# Patient Record
Sex: Male | Born: 1950 | Race: Black or African American | Hispanic: No | Marital: Married | Smoking: Never smoker
Health system: Southern US, Community
[De-identification: ages and names within clinical notes are randomized; demographics above are authoritative.]

## PROBLEM LIST (undated history)

## (undated) DIAGNOSIS — N183 Chronic kidney disease, stage 3 unspecified: Secondary | ICD-10-CM

## (undated) DIAGNOSIS — Z9289 Personal history of other medical treatment: Secondary | ICD-10-CM

## (undated) DIAGNOSIS — I779 Disorder of arteries and arterioles, unspecified: Secondary | ICD-10-CM

## (undated) DIAGNOSIS — D490 Neoplasm of unspecified behavior of digestive system: Secondary | ICD-10-CM

## (undated) DIAGNOSIS — G4733 Obstructive sleep apnea (adult) (pediatric): Secondary | ICD-10-CM

## (undated) DIAGNOSIS — G459 Transient cerebral ischemic attack, unspecified: Secondary | ICD-10-CM

## (undated) DIAGNOSIS — I493 Ventricular premature depolarization: Secondary | ICD-10-CM

## (undated) DIAGNOSIS — I10 Essential (primary) hypertension: Secondary | ICD-10-CM

## (undated) DIAGNOSIS — K219 Gastro-esophageal reflux disease without esophagitis: Secondary | ICD-10-CM

## (undated) DIAGNOSIS — I739 Peripheral vascular disease, unspecified: Secondary | ICD-10-CM

## (undated) DIAGNOSIS — E785 Hyperlipidemia, unspecified: Secondary | ICD-10-CM

## (undated) DIAGNOSIS — I2510 Atherosclerotic heart disease of native coronary artery without angina pectoris: Secondary | ICD-10-CM

## (undated) HISTORY — DX: Ventricular premature depolarization: I49.3

## (undated) HISTORY — DX: Chronic kidney disease, stage 3 unspecified: N18.30

## (undated) HISTORY — DX: Atherosclerotic heart disease of native coronary artery without angina pectoris: I25.10

## (undated) HISTORY — DX: Morbid (severe) obesity due to excess calories: E66.01

## (undated) HISTORY — DX: Hyperlipidemia, unspecified: E78.5

## (undated) HISTORY — DX: Essential (primary) hypertension: I10

## (undated) HISTORY — DX: Neoplasm of unspecified behavior of digestive system: D49.0

## (undated) HISTORY — DX: Peripheral vascular disease, unspecified: I73.9

## (undated) HISTORY — DX: Transient cerebral ischemic attack, unspecified: G45.9

## (undated) HISTORY — DX: Personal history of other medical treatment: Z92.89

## (undated) HISTORY — DX: Obstructive sleep apnea (adult) (pediatric): G47.33

## (undated) HISTORY — DX: Disorder of arteries and arterioles, unspecified: I77.9

## (undated) HISTORY — DX: Chronic kidney disease, stage 3 (moderate): N18.3

## (undated) HISTORY — DX: Gastro-esophageal reflux disease without esophagitis: K21.9

---

## 2003-03-07 ENCOUNTER — Encounter: Payer: Self-pay | Admitting: Internal Medicine

## 2004-04-29 ENCOUNTER — Ambulatory Visit: Payer: Self-pay | Admitting: Internal Medicine

## 2004-07-30 ENCOUNTER — Emergency Department (HOSPITAL_COMMUNITY): Admission: EM | Admit: 2004-07-30 | Discharge: 2004-07-30 | Payer: Self-pay | Admitting: Emergency Medicine

## 2004-07-30 ENCOUNTER — Ambulatory Visit: Payer: Self-pay | Admitting: Internal Medicine

## 2004-07-31 ENCOUNTER — Ambulatory Visit: Payer: Self-pay | Admitting: Internal Medicine

## 2004-08-08 ENCOUNTER — Ambulatory Visit: Payer: Self-pay | Admitting: Internal Medicine

## 2004-08-16 ENCOUNTER — Encounter: Payer: Self-pay | Admitting: Internal Medicine

## 2004-08-26 ENCOUNTER — Ambulatory Visit: Payer: Self-pay | Admitting: Internal Medicine

## 2005-03-20 ENCOUNTER — Ambulatory Visit: Payer: Self-pay | Admitting: Internal Medicine

## 2005-05-21 ENCOUNTER — Ambulatory Visit: Payer: Self-pay | Admitting: Internal Medicine

## 2005-05-23 ENCOUNTER — Ambulatory Visit: Payer: Self-pay | Admitting: Internal Medicine

## 2005-07-11 ENCOUNTER — Ambulatory Visit: Payer: Self-pay | Admitting: Internal Medicine

## 2005-11-14 ENCOUNTER — Ambulatory Visit: Payer: Self-pay | Admitting: Internal Medicine

## 2005-12-16 ENCOUNTER — Ambulatory Visit: Payer: Self-pay | Admitting: Internal Medicine

## 2005-12-30 ENCOUNTER — Ambulatory Visit: Payer: Self-pay | Admitting: Internal Medicine

## 2006-02-23 ENCOUNTER — Ambulatory Visit: Payer: Self-pay | Admitting: Family Medicine

## 2006-05-22 ENCOUNTER — Ambulatory Visit: Payer: Self-pay | Admitting: Internal Medicine

## 2006-06-12 ENCOUNTER — Ambulatory Visit: Payer: Self-pay | Admitting: Internal Medicine

## 2006-06-22 ENCOUNTER — Ambulatory Visit: Payer: Self-pay | Admitting: Internal Medicine

## 2006-06-22 LAB — CONVERTED CEMR LAB
ALT: 18 units/L (ref 0–40)
Albumin: 3.2 g/dL — ABNORMAL LOW (ref 3.5–5.2)
Calcium: 8.7 mg/dL (ref 8.4–10.5)
Creatinine, Ser: 1.4 mg/dL (ref 0.4–1.5)
Glucose, Bld: 106 mg/dL — ABNORMAL HIGH (ref 70–99)
PSA: 0.21 ng/mL (ref 0.10–4.00)
Sodium: 140 meq/L (ref 135–145)
Triglycerides: 91 mg/dL (ref 0–149)

## 2006-11-13 DIAGNOSIS — I6350 Cerebral infarction due to unspecified occlusion or stenosis of unspecified cerebral artery: Secondary | ICD-10-CM | POA: Insufficient documentation

## 2006-11-13 DIAGNOSIS — J309 Allergic rhinitis, unspecified: Secondary | ICD-10-CM | POA: Insufficient documentation

## 2006-11-13 DIAGNOSIS — I10 Essential (primary) hypertension: Secondary | ICD-10-CM | POA: Insufficient documentation

## 2007-01-18 ENCOUNTER — Ambulatory Visit: Payer: Self-pay | Admitting: Internal Medicine

## 2007-01-18 DIAGNOSIS — E785 Hyperlipidemia, unspecified: Secondary | ICD-10-CM | POA: Insufficient documentation

## 2007-01-18 DIAGNOSIS — R079 Chest pain, unspecified: Secondary | ICD-10-CM

## 2007-01-18 DIAGNOSIS — F438 Other reactions to severe stress: Secondary | ICD-10-CM | POA: Insufficient documentation

## 2007-01-21 ENCOUNTER — Encounter: Payer: Self-pay | Admitting: Internal Medicine

## 2007-01-22 ENCOUNTER — Ambulatory Visit: Payer: Self-pay | Admitting: Internal Medicine

## 2007-01-25 ENCOUNTER — Ambulatory Visit: Payer: Self-pay | Admitting: Internal Medicine

## 2007-01-25 ENCOUNTER — Telehealth: Payer: Self-pay | Admitting: Internal Medicine

## 2007-02-09 ENCOUNTER — Ambulatory Visit: Payer: Self-pay | Admitting: Internal Medicine

## 2007-02-10 ENCOUNTER — Encounter (INDEPENDENT_AMBULATORY_CARE_PROVIDER_SITE_OTHER): Payer: Self-pay | Admitting: *Deleted

## 2007-02-19 ENCOUNTER — Ambulatory Visit: Payer: Self-pay | Admitting: Internal Medicine

## 2007-03-07 ENCOUNTER — Encounter: Payer: Self-pay | Admitting: Internal Medicine

## 2007-03-08 ENCOUNTER — Ambulatory Visit: Payer: Self-pay | Admitting: Internal Medicine

## 2007-04-09 ENCOUNTER — Ambulatory Visit: Payer: Self-pay | Admitting: Internal Medicine

## 2007-04-19 ENCOUNTER — Telehealth: Payer: Self-pay | Admitting: Family Medicine

## 2007-04-20 ENCOUNTER — Encounter: Payer: Self-pay | Admitting: Internal Medicine

## 2007-04-22 HISTORY — PX: CARDIAC CATHETERIZATION: SHX172

## 2007-04-29 ENCOUNTER — Ambulatory Visit: Payer: Self-pay | Admitting: Internal Medicine

## 2007-07-05 ENCOUNTER — Ambulatory Visit: Payer: Self-pay | Admitting: Cardiovascular Disease

## 2007-07-23 ENCOUNTER — Encounter (INDEPENDENT_AMBULATORY_CARE_PROVIDER_SITE_OTHER): Payer: Self-pay | Admitting: *Deleted

## 2007-08-30 ENCOUNTER — Encounter: Payer: Self-pay | Admitting: Internal Medicine

## 2008-02-09 ENCOUNTER — Ambulatory Visit: Payer: Self-pay | Admitting: Gastroenterology

## 2008-03-27 ENCOUNTER — Ambulatory Visit: Payer: Self-pay | Admitting: Internal Medicine

## 2008-04-19 ENCOUNTER — Ambulatory Visit: Payer: Self-pay | Admitting: Internal Medicine

## 2008-05-08 ENCOUNTER — Ambulatory Visit: Payer: Self-pay | Admitting: Orthopedic Surgery

## 2009-01-15 ENCOUNTER — Ambulatory Visit: Payer: Self-pay | Admitting: Psychiatry

## 2009-01-22 ENCOUNTER — Ambulatory Visit: Payer: Self-pay | Admitting: Psychiatry

## 2009-04-21 HISTORY — PX: CARDIAC CATHETERIZATION: SHX172

## 2010-03-13 ENCOUNTER — Ambulatory Visit: Payer: Self-pay | Admitting: Cardiovascular Disease

## 2010-05-17 IMAGING — CT CT HEAD WITHOUT CONTRAST
2 series · 15 of 30 positions shown, 19 images · non-contrast
Comparison: none

REASON FOR EXAM: Head Injury
COMMENTS:

[Series 2: without · axial · non-contrast · 0.41mm/px · z∈[-126,+4]mm · 13 of 32 slices shown, 17 images]
[im 3/32  brain]
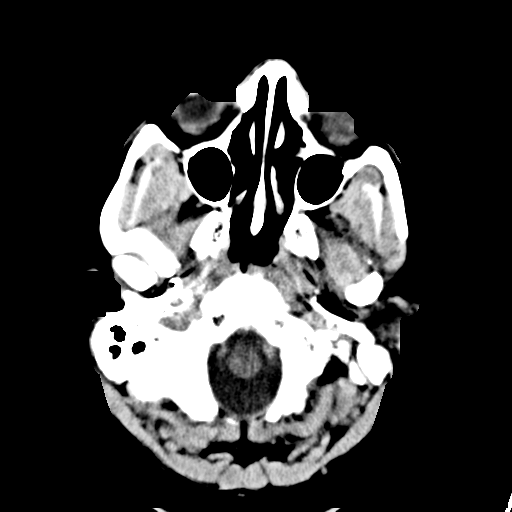
[im 3/32  bone]
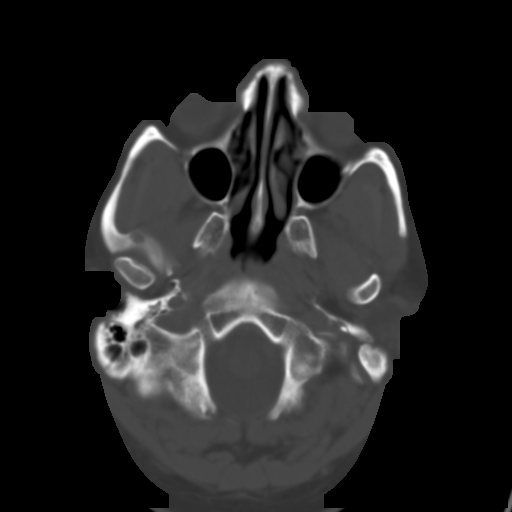
[im 5/32  brain]
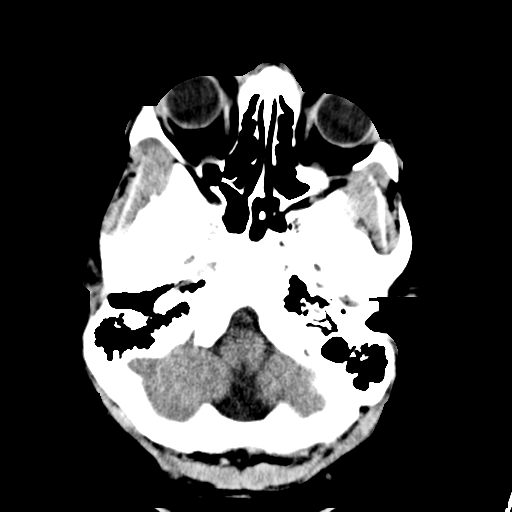
[im 7/32  brain]
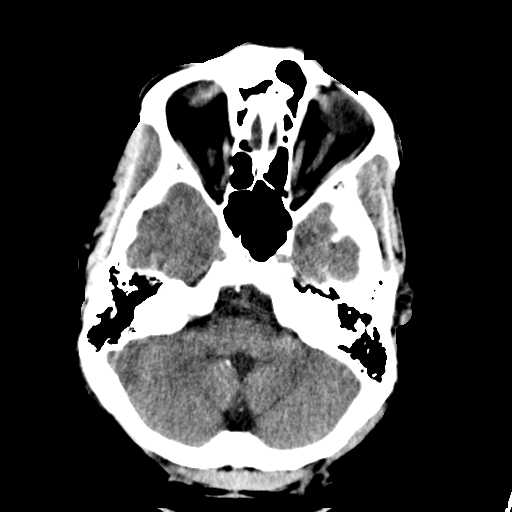
[im 9/32  brain]
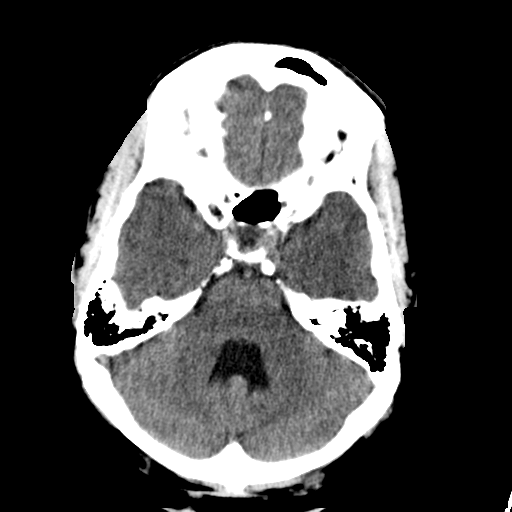
[im 12/32  brain]
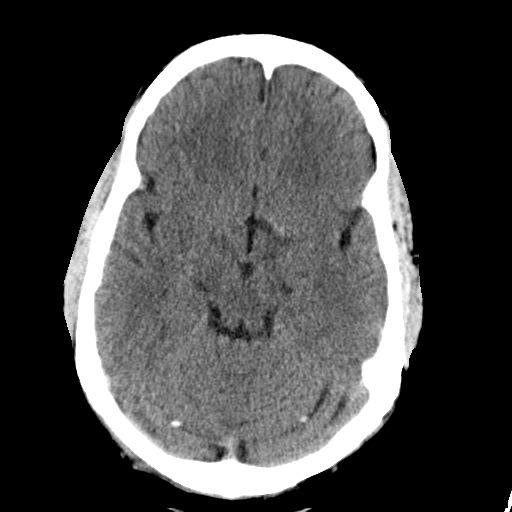
[im 12/32  bone]
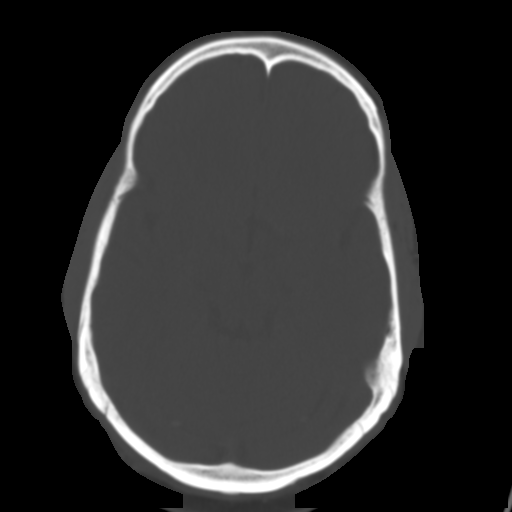
[im 14/32  brain]
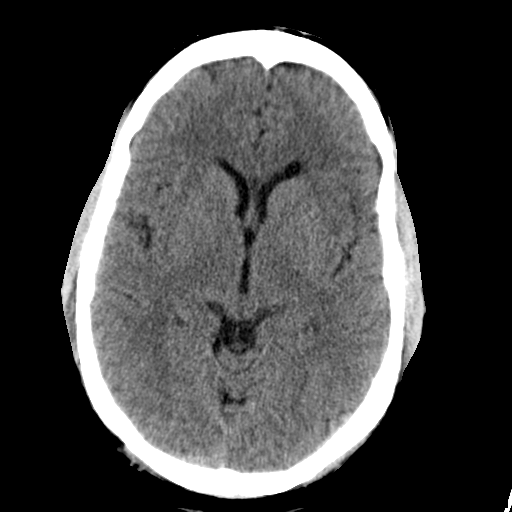
[im 16/32  brain]
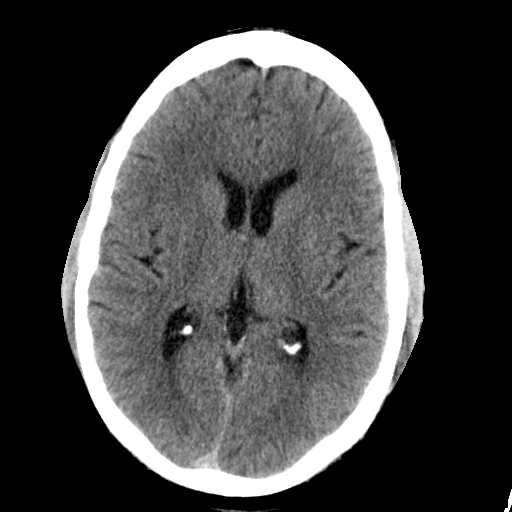
[im 18/32  brain]
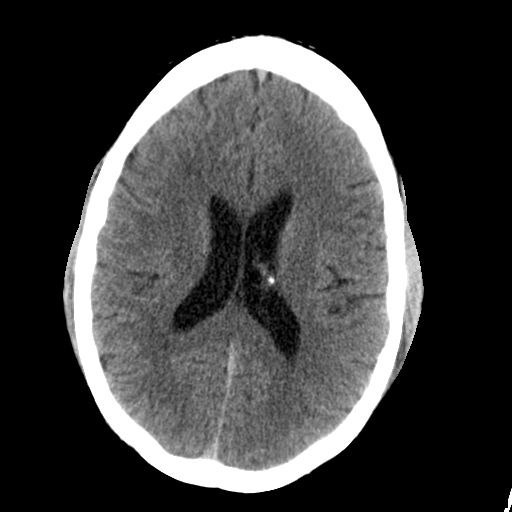
[im 20/32  brain]
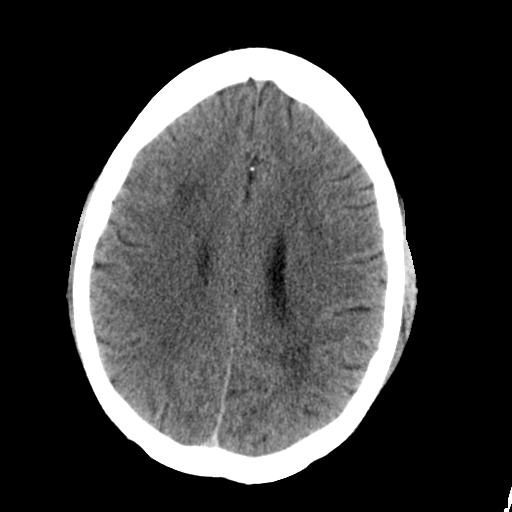
[im 20/32  bone]
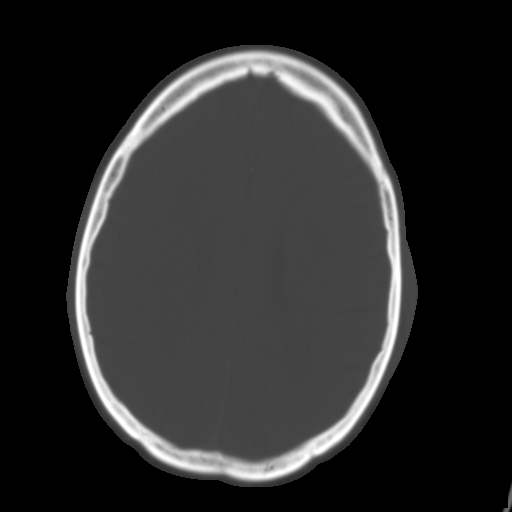
[im 23/32  brain]
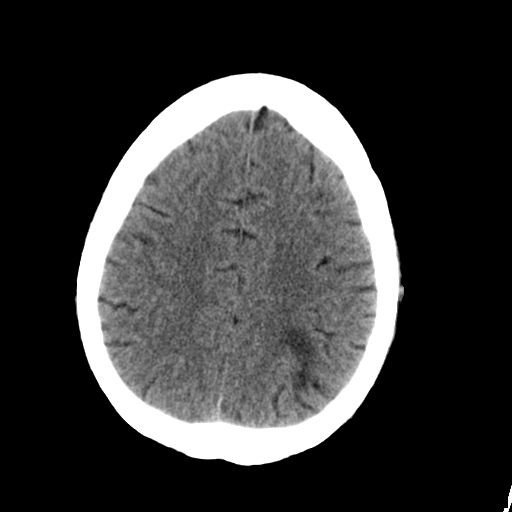
[im 25/32  brain]
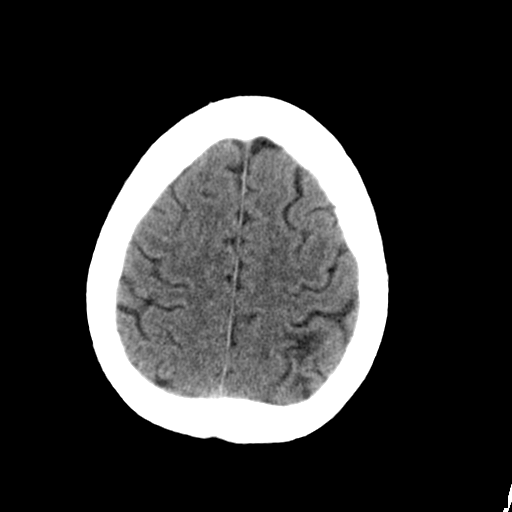
[im 27/32  brain]
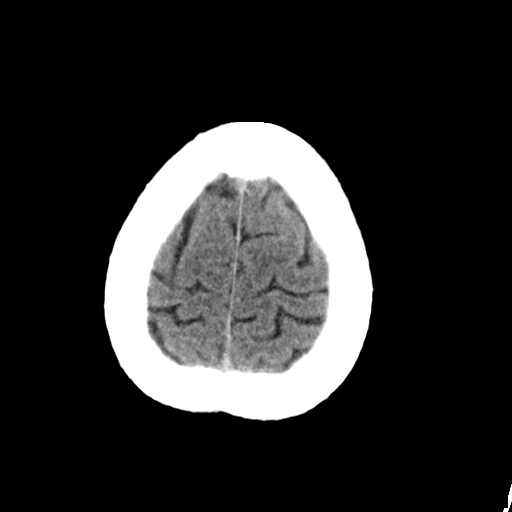
[im 29/32  brain]
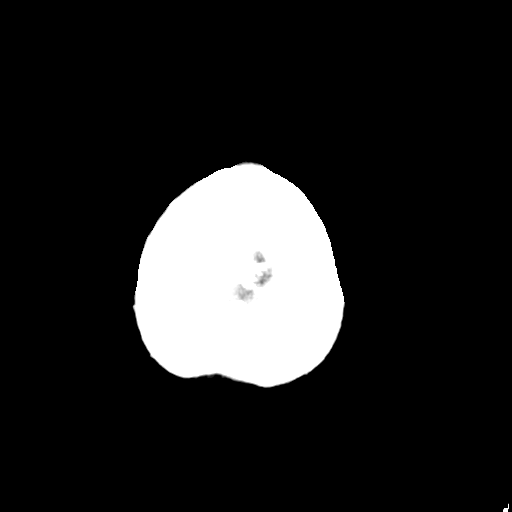
[im 29/32  bone]
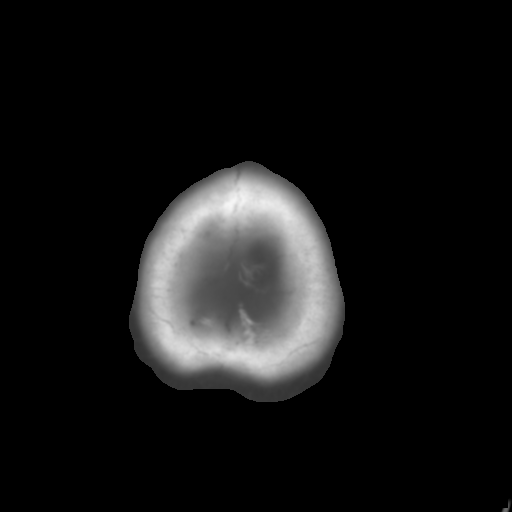

[Series 3: bone · axial · 0.41mm/px · z∈[-126,-106]mm · 2 of 32 slices shown]
[im 3/32  bone]
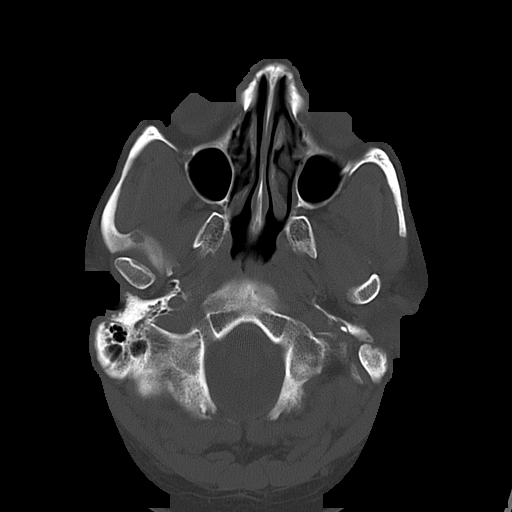
[im 7/32  bone]
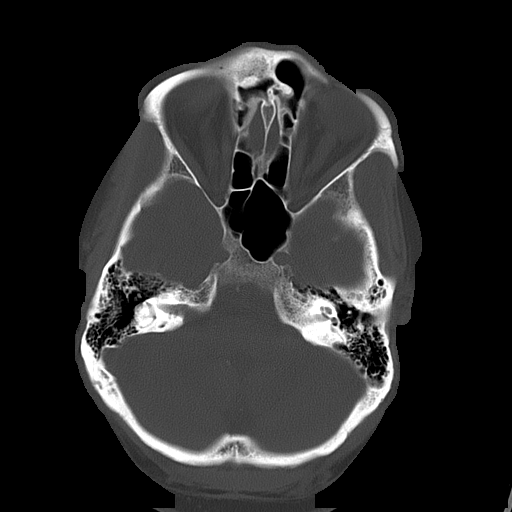

[15 of 30 positions shown; findings below may reference images not displayed]

PROCEDURE:     CT  - CT HEAD WITHOUT CONTRAST  - April 19, 2008  [DATE]

RESULT:     The patient has a history of head injury approximately 2 weeks
ago. Axial imaging was performed through the brain without IV contrast and
images were acquired at soft tissue and bone windows.

There is abnormal hypodensity in the posterior parietal deep white matter on
the LEFT. There is somewhat similar but slightly less conspicuous
hypodensity in the deep white matter of the RIGHT frontal lobe and RIGHT
posterior parietal lobes. These findings may reflect the sequelae of chronic
small vessel ischemia. They are not classic for posttraumatic injury though
this cannot be entirely excluded. The ventricles are normal in size and
position. There is no evidence of an intracranial hemorrhage. The cerebellum
and brainstem exhibit no acute abnormality. At bone window settings there is
no evidence of an acute skull fracture. The mastoid air cells are well
pneumatized and the observed portions of the paranasal sinuses are clear.
IMPRESSION: 1. I do not see evidence of acute or subacute or old intracranial
hemorrhage.
2. Hypodensity in the deep white matter of both cerebral hemispheres as
described is most compatible with prior small vessel ischemic type insult.
This may indicate underlying chronic hypertension as well. Follow-up MRI is
recommended to better characterize these areas of hypodensity.
3. There is no evidence of hydrocephalus and no acute bony abnormality of
the calvarium is seen.

## 2010-06-05 IMAGING — NM NM BONE LIMITED
1 series · 10 of 10 positions shown · non-contrast
Comparison: none

REASON FOR EXAM: left chest shoulder rib  pain
COMMENTS:

[Series 1000: bone statics · 2.40mm/px · 5 acquisitions, 10 frames shown]
[im 1/5]
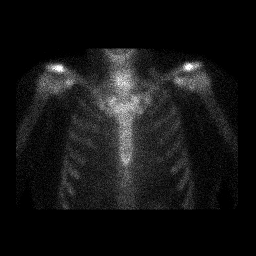
[im 1/5]
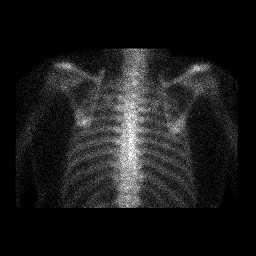
[im 2/5]
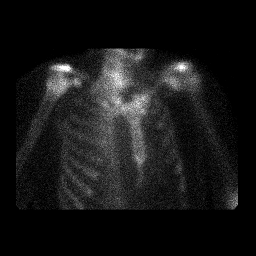
[im 2/5]
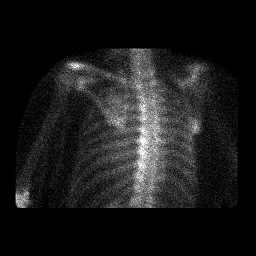
[im 3/5]
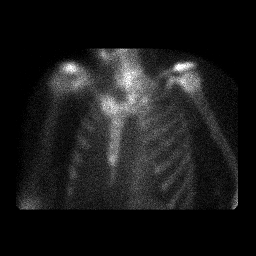
[im 3/5]
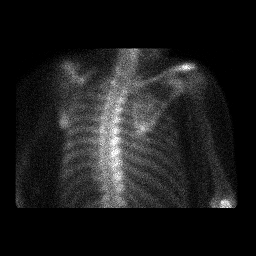
[im 4/5]
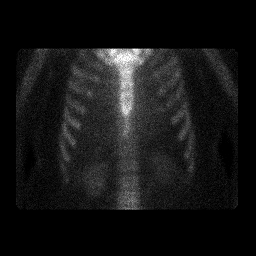
[im 4/5]
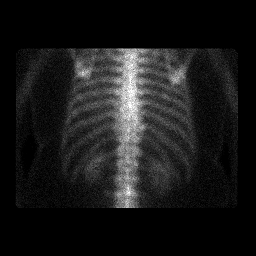
[im 5/5]
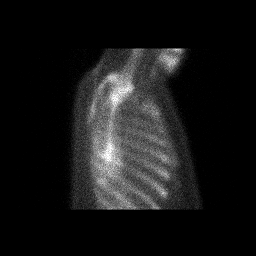
[im 5/5]
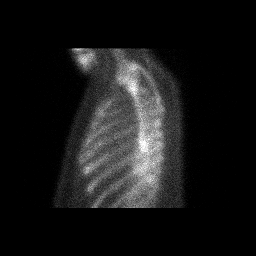

[10 of 10 positions shown; findings below may reference images not displayed]

PROCEDURE:     NM  - NM LIMITED BONE SCAN 3HR [DATE]  [DATE]

RESULT:     Focused Nuclear Medicine Bone Scan was performed of the chest.

The patient received 23.37 mCi of Tc 99m labeled MDP via a right antecubital
injection.

There is no evidence of asymmetric focal osseous remodeling appreciated.
Appropriate radiotracer activity is identified within the region of the
right and left kidneys.
IMPRESSION: Unremarkable focused Nuclear Medicine Bone Scan as
described above.

## 2011-08-18 ENCOUNTER — Emergency Department: Payer: Self-pay | Admitting: Emergency Medicine

## 2012-06-09 DIAGNOSIS — I10 Essential (primary) hypertension: Secondary | ICD-10-CM | POA: Insufficient documentation

## 2012-07-27 ENCOUNTER — Encounter: Payer: Self-pay | Admitting: Cardiovascular Disease

## 2012-07-27 ENCOUNTER — Ambulatory Visit (INDEPENDENT_AMBULATORY_CARE_PROVIDER_SITE_OTHER): Payer: Medicare Other | Admitting: Cardiovascular Disease

## 2012-07-27 VITALS — BP 132/84 | HR 57 | Ht 75.0 in | Wt 270.2 lb

## 2012-07-27 DIAGNOSIS — R0789 Other chest pain: Secondary | ICD-10-CM

## 2012-07-27 DIAGNOSIS — I493 Ventricular premature depolarization: Secondary | ICD-10-CM

## 2012-07-27 DIAGNOSIS — E785 Hyperlipidemia, unspecified: Secondary | ICD-10-CM

## 2012-07-27 DIAGNOSIS — R0600 Dyspnea, unspecified: Secondary | ICD-10-CM

## 2012-07-27 DIAGNOSIS — I4949 Other premature depolarization: Secondary | ICD-10-CM

## 2012-07-27 DIAGNOSIS — I10 Essential (primary) hypertension: Secondary | ICD-10-CM

## 2012-07-27 DIAGNOSIS — I2510 Atherosclerotic heart disease of native coronary artery without angina pectoris: Secondary | ICD-10-CM

## 2012-07-27 DIAGNOSIS — R0609 Other forms of dyspnea: Secondary | ICD-10-CM

## 2012-07-27 DIAGNOSIS — R0989 Other specified symptoms and signs involving the circulatory and respiratory systems: Secondary | ICD-10-CM

## 2012-07-27 NOTE — Patient Instructions (Addendum)
Your physician has requested that you have an echocardiogram. Echocardiography is a painless test that uses sound waves to create images of your heart. It provides your doctor with information about the size and shape of your heart and how well your heart's chambers and valves are working. This procedure takes approximately one hour. There are no restrictions for this procedure.  Labs today.   Your physician has recommended that you wear a holter monitor. Holter monitors are medical devices that record the heart's electrical activity. Doctors most often use these monitors to diagnose arrhythmias. Arrhythmias are problems with the speed or rhythm of the heartbeat. The monitor is a small, portable device. You can wear one while you do your normal daily activities. This is usually used to diagnose what is causing palpitations/syncope (passing out).  Follow up after tests

## 2012-07-28 LAB — MAGNESIUM: Magnesium: 1.8 mg/dL (ref 1.6–2.6)

## 2012-07-29 ENCOUNTER — Encounter: Payer: Self-pay | Admitting: Cardiovascular Disease

## 2012-07-29 DIAGNOSIS — I493 Ventricular premature depolarization: Secondary | ICD-10-CM | POA: Insufficient documentation

## 2012-07-29 DIAGNOSIS — I2510 Atherosclerotic heart disease of native coronary artery without angina pectoris: Secondary | ICD-10-CM | POA: Insufficient documentation

## 2012-07-29 NOTE — Progress Notes (Signed)
Primary care physician: Dr. Dario Guardian  HPI  This is a pleasant 62 year old male who was referred by Dr. Dario Guardian for evaluation of excessive PVCs noted during stress test. The patient is known to me from my previous care at [facility] more than 3 years ago. He is known to have refractory hypertension with no evidence of renal artery stenosis. He also suffers from chronic chest pain likely musculoskeletal with cardiac catheterization done twice in 2009 and 2011. Both times there was mild to moderate CAD without evidence of obstructive disease. He also has known history of sleep apnea does not tolerate CPAP well. He was first noted to have frequent PVCs more than 2 years ago during his sleep study and has been treated medically with a beta blocker. He did not tolerate the higher dose of carvedilol. He recently underwent a treadmill nuclear stress test. The test overall showed no evidence of ischemia or infarcts with an ejection fraction of 73%. However, he was noted to have frequent PVCs during exercise and early recovery. The patient denies any palpitations. There has been no syncope or presyncope. His recent labs were reviewed which showed a creatinine of 1.59. Potassium was 3.8. CBC was unremarkable.  Allergies  Allergen Reactions  . Simvastatin     REACTION: Nausea     No current outpatient prescriptions on file prior to visit.   No current facility-administered medications on file prior to visit.     Past Medical History  Diagnosis Date  . Esophageal reflux   . Unspecified disorder of kidney and ureter   . TIA (transient ischemic attack)   . Hypertension   . CAD (coronary artery disease)   . Asymptomatic PVCs   . Chronic kidney disease     Most recent creatinine was 1.59  . Pure hypercholesterolemia      Past Surgical History  Procedure Laterality Date  . Cardiac catheterization  2011    50% mid LAD stenosis, 40% proximal LCX, EF 60%  . Cardiac catheterization   2009    Mild CAD               Family History  Problem Relation Age of Onset  . Heart attack Father     MI     History   Social History  . Marital Status: Married    Spouse Name: N/A    Number of Children: N/A  . Years of Education: N/A   Occupational History  . Not on file.   Social History Main Topics  . Smoking status: Never Smoker   . Smokeless tobacco: Not on file  . Alcohol Use: Yes     Comment: socail drinker  . Drug Use: No  . Sexually Active: Not on file   Other Topics Concern  . Not on file   Social History Narrative  . No narrative on file     ROS Constitutional: Negative for fever, chills, diaphoresis, activity change, appetite change and fatigue.  HENT: Negative for hearing loss, nosebleeds, congestion, sore throat, facial swelling, drooling, trouble swallowing, neck pain, voice change, sinus pressure and tinnitus.  Eyes: Negative for photophobia, pain, discharge and visual disturbance.  Respiratory: Negative for apnea, cough, chest tightness and wheezing.  Cardiovascular: Negative for palpitations and leg swelling.  Gastrointestinal: Negative for nausea, vomiting, abdominal pain, diarrhea, constipation, blood in stool and abdominal distention.  Genitourinary: Negative for dysuria, urgency, frequency, hematuria and decreased urine volume.  Musculoskeletal: Negative for myalgias, back pain, joint swelling, arthralgias and gait  problem.  Skin: Negative for color change, pallor, rash and wound.  Neurological: Negative for dizziness, tremors, seizures, syncope, speech difficulty, weakness, light-headedness, numbness and headaches.  Psychiatric/Behavioral: Negative for suicidal ideas, hallucinations, behavioral problems and agitation. The patient is not nervous/anxious.     PHYSICAL EXAM   BP 132/84  Pulse 57  Ht 6\' 3"  (1.905 m)  Wt 270 lb 4 oz (122.585 kg)  BMI 33.78 kg/m2 Constitutional: He is oriented to person, place, and time. He appears  well-developed and well-nourished. No distress.  HENT: No nasal discharge.  Head: Normocephalic and atraumatic.  Eyes: Pupils are equal and round. Right eye exhibits no discharge. Left eye exhibits no discharge.  Neck: Normal range of motion. Neck supple. No JVD present. No thyromegaly present.  Cardiovascular: Normal rate, regular rhythm, normal heart sounds and. Exam reveals no gallop and no friction rub. No murmur heard.  Pulmonary/Chest: Effort normal and breath sounds normal. No stridor. No respiratory distress. He has no wheezes. He has no rales. He exhibits no tenderness.  Abdominal: Soft. Bowel sounds are normal. He exhibits no distension. There is no tenderness. There is no rebound and no guarding.  Musculoskeletal: Normal range of motion. He exhibits no edema and no tenderness.  Neurological: He is alert and oriented to person, place, and time. Coordination normal.  Skin: Skin is warm and dry. No rash noted. He is not diaphoretic. No erythema. No pallor.  Psychiatric: He has a normal mood and affect. His behavior is normal. Judgment and thought content normal.       [id number]:[id number]  Bradycardia  - occasional PAC    # PACs = 1. -Nonspecific ST depression   +   Nonspecific T-abnormality.   ABNORMAL    ASSESSMENT AND PLAN

## 2012-07-29 NOTE — Assessment & Plan Note (Signed)
His blood pressure is reasonably controlled on current medications which he has been on now for a few years.

## 2012-07-29 NOTE — Assessment & Plan Note (Signed)
Samuel Mason is known to have frequent PVCs which were initially noted during sleep study more than 3 years ago. Overall, he appears to be asymptomatic from this. Recent nuclear stress test showed no evidence of ischemia or scar. Ejection fraction was normal. I think the main issue here is to make sure he does not have significant structural abnormalities related to prolonged history of refractory hypertension. Thus, I will obtain an echocardiogram for evaluation. The other issue is on modification of his PVCs. If he is having more than 10,000 PVCs and 24 hours, he might require treatment to suppress the arrhythmia in order to prevent possible cardiomyopathy related to this in the future. I will also check TSH and magnesium level today.

## 2012-07-29 NOTE — Assessment & Plan Note (Signed)
His recent labs were reviewed which showed an LDL of 160. I believe he is intolerant to statins and currently he is on that the red yeast rice. Consider small dose Crestor if this has not been attempted in the past.

## 2012-07-29 NOTE — Progress Notes (Signed)
Pt informed of normal lab results. Will contact [facility] to see when pt will be able to receive monitor since they haven't heard from the as of now.

## 2012-07-29 NOTE — Assessment & Plan Note (Signed)
He has chronic chest pain likely musculoskeletal with recent negative stress test. Continue medical therapy.

## 2012-07-30 NOTE — Addendum Note (Signed)
Addended by: Festus Aloe on: 07/30/2012 01:50 PM   Modules accepted: Orders

## 2012-07-30 NOTE — Progress Notes (Signed)
Pt informed of lab results. 

## 2012-08-17 ENCOUNTER — Other Ambulatory Visit (INDEPENDENT_AMBULATORY_CARE_PROVIDER_SITE_OTHER): Payer: Medicare Other

## 2012-08-17 ENCOUNTER — Other Ambulatory Visit: Payer: Self-pay

## 2012-08-17 DIAGNOSIS — R0600 Dyspnea, unspecified: Secondary | ICD-10-CM

## 2012-08-17 DIAGNOSIS — R0609 Other forms of dyspnea: Secondary | ICD-10-CM

## 2012-08-18 ENCOUNTER — Ambulatory Visit (INDEPENDENT_AMBULATORY_CARE_PROVIDER_SITE_OTHER): Payer: Medicare Other

## 2012-08-18 DIAGNOSIS — I493 Ventricular premature depolarization: Secondary | ICD-10-CM

## 2012-08-18 DIAGNOSIS — I4949 Other premature depolarization: Secondary | ICD-10-CM

## 2012-08-19 ENCOUNTER — Encounter: Payer: Self-pay | Admitting: Cardiovascular Disease

## 2012-08-19 ENCOUNTER — Ambulatory Visit (INDEPENDENT_AMBULATORY_CARE_PROVIDER_SITE_OTHER): Payer: Medicare Other | Admitting: Cardiovascular Disease

## 2012-08-19 VITALS — BP 139/89 | HR 56 | Ht 75.0 in | Wt 270.8 lb

## 2012-08-19 DIAGNOSIS — I10 Essential (primary) hypertension: Secondary | ICD-10-CM

## 2012-08-19 DIAGNOSIS — I2510 Atherosclerotic heart disease of native coronary artery without angina pectoris: Secondary | ICD-10-CM

## 2012-08-19 DIAGNOSIS — I4949 Other premature depolarization: Secondary | ICD-10-CM

## 2012-08-19 DIAGNOSIS — I493 Ventricular premature depolarization: Secondary | ICD-10-CM

## 2012-08-19 NOTE — Assessment & Plan Note (Signed)
He has chronic musculoskeletal chest pain and no previous symptoms of angina. Recent stress test showed no evidence of ischemia. Continue medical therapy.

## 2012-08-19 NOTE — Assessment & Plan Note (Signed)
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 There is no evidence of ischemic obstructive coronary artery disease or structural heart disease and his echocardiogram was unremarkable. As a matter of fact, his left ventricular hypertrophy seems to be better than before. On average, there was 4000 PVCs over 24 hours which is below the threshold of requiring antiarrhythmic treatment especially that he is asymptomatic and his LV systolic function is quite good. Continue current dose of carvedilol.

## 2012-08-19 NOTE — Patient Instructions (Addendum)
Continue same medications.  Follow up in 6 months.  

## 2012-08-19 NOTE — Assessment & Plan Note (Signed)
His blood pressure has been well-controlled on current medications. He follows at the hypertension clinic at [facility].

## 2012-08-19 NOTE — Progress Notes (Signed)
Primary care physician: Dr. Dario Guardian  HPI  This is a pleasant 62 year old male who is here today for a followup visit regarding frequent PVCs noted during stress test.  He is known to have refractory hypertension with no evidence of renal artery stenosis. He also suffers from chronic chest pain likely musculoskeletal with cardiac catheterization done twice in 2009 and 2011. Both times there was mild to moderate CAD without evidence of obstructive disease. He also has known history of sleep apnea does not tolerate CPAP well. He was first noted to have frequent PVCs more than 2 years ago during his sleep study and has been treated medically with a beta blocker. He did not tolerate the higher dose of carvedilol due to fatigue and bradycardia. He recently underwent a treadmill nuclear stress test. The test overall showed no evidence of ischemia or infarcts with an ejection fraction of 73%. However, he was noted to have frequent PVCs during exercise and early recovery. The patient denies any palpitations. There has been no syncope or presyncope. His recent labs were reviewed which showed a creatinine of 1.59. Potassium was 3.8. CBC was unremarkable. I proceeded with an echocardiogram which showed normal LV systolic function with only mild left ventricular hypertrophy. 48 hour Holter monitor showed frequent and mostly monomorphic PVCs was very brief runs of supraventricular tachycardia. In total, there was 7965 PVC in 48 hours.  Allergies  Allergen Reactions  . Simvastatin     REACTION: Nausea     Current Outpatient Prescriptions on File Prior to Visit  Medication Sig Dispense Refill  . amLODipine (NORVASC) 10 MG tablet Take 15 mg by mouth daily.       [name] aspirin 81 MG tablet Take 81 mg by mouth daily.      . calcium-vitamin D (OSCAL) 250-125 MG-UNIT per tablet Take 1 tablet by mouth daily.      . carvedilol (COREG) 12.5 MG tablet Take 12.5 mg by mouth 2 (two) times daily with a meal.      . celecoxib  (CELEBREX) 200 MG capsule Take 200 mg by mouth daily as needed for pain.      [name] eplerenone (INSPRA) 50 MG tablet Take 50 mg by mouth daily.      [name] esomeprazole (NEXIUM) 40 MG capsule Take 40 mg by mouth as needed.       . ezetimibe (ZETIA) 10 MG tablet Take 10 mg by mouth daily.      . hydrochlorothiazide (HYDRODIURIL) 25 MG tablet Take 25 mg by mouth daily.      [name] losartan-hydrochlorothiazide (HYZAAR) 100-25 MG per tablet Take 1 tablet by mouth daily.      . Omega-3 Fatty Acids (FISH OIL) 1000 MG CPDR Take 1,000 mg by mouth daily.      . Red Yeast Rice 600 MG CAPS Take 600 mg by mouth daily.      . sertraline (ZOLOFT) 25 MG tablet Take 25 mg by mouth as needed.        No current facility-administered medications on file prior to visit.     Past Medical History  Diagnosis Date  . Esophageal reflux   . Unspecified disorder of kidney and ureter   . TIA (transient ischemic attack)   . Hypertension   . CAD (coronary artery disease)   . Asymptomatic PVCs   . Chronic kidney disease     Most recent creatinine was 1.59  . Pure hypercholesterolemia      Past Surgical History  Procedure Laterality Date  .  Cardiac catheterization  2011    50% mid LAD stenosis, 40% proximal LCX, EF 60%  . Cardiac catheterization  2009    Mild CAD               Family History  Problem Relation Age of Onset  . Heart attack Father     MI     History   Social History  . Marital Status: Married    Spouse Name: N/A    Number of Children: N/A  . Years of Education: N/A   Occupational History  . Not on file.   Social History Main Topics  . Smoking status: Never Smoker   . Smokeless tobacco: Not on file  . Alcohol Use: No     Comment: socail drinker  . Drug Use: No  . Sexually Active: Not on file   Other Topics Concern  . Not on file   Social History Narrative  . No narrative on file       PHYSICAL EXAM   BP 139/89  Pulse 56  Ht 6\' 3"  (1.905 m)  Wt 270 lb 12 oz (122.811 kg)   BMI 33.84 kg/m2 Constitutional: He is oriented to person, place, and time. He appears well-developed and well-nourished. No distress.  HENT: No nasal discharge.  Head: Normocephalic and atraumatic.  Eyes: Pupils are equal and round. Right eye exhibits no discharge. Left eye exhibits no discharge.  Neck: Normal range of motion. Neck supple. No JVD present. No thyromegaly present.  Cardiovascular: Normal rate, regular rhythm, normal heart sounds and. Exam reveals no gallop and no friction rub. No murmur heard.  Pulmonary/Chest: Effort normal and breath sounds normal. No stridor. No respiratory distress. He has no wheezes. He has no rales. He exhibits no tenderness.  Abdominal: Soft. Bowel sounds are normal. He exhibits no distension. There is no tenderness. There is no rebound and no guarding.  Musculoskeletal: Normal range of motion. He exhibits no edema and no tenderness.  Neurological: He is alert and oriented to person, place, and time. Coordination normal.  Skin: Skin is warm and dry. No rash noted. He is not diaphoretic. No erythema. No pallor.  Psychiatric: He has a normal mood and affect. His behavior is normal. Judgment and thought content normal.       ASSESSMENT AND PLAN

## 2013-02-24 ENCOUNTER — Other Ambulatory Visit: Payer: Self-pay

## 2013-04-04 ENCOUNTER — Ambulatory Visit (INDEPENDENT_AMBULATORY_CARE_PROVIDER_SITE_OTHER): Payer: Medicare Other | Admitting: Cardiovascular Disease

## 2013-04-04 ENCOUNTER — Encounter: Payer: Self-pay | Admitting: Cardiovascular Disease

## 2013-04-04 VITALS — BP 148/90 | HR 73 | Ht 75.0 in | Wt 275.0 lb

## 2013-04-04 DIAGNOSIS — I10 Essential (primary) hypertension: Secondary | ICD-10-CM

## 2013-04-04 DIAGNOSIS — E785 Hyperlipidemia, unspecified: Secondary | ICD-10-CM

## 2013-04-04 DIAGNOSIS — I4949 Other premature depolarization: Secondary | ICD-10-CM

## 2013-04-04 DIAGNOSIS — I2510 Atherosclerotic heart disease of native coronary artery without angina pectoris: Secondary | ICD-10-CM

## 2013-04-04 DIAGNOSIS — I493 Ventricular premature depolarization: Secondary | ICD-10-CM

## 2013-04-04 NOTE — Progress Notes (Signed)
Primary care physician: Dr. Dario Guardian  HPI  This is a pleasant 62 year old male who is here today for a followup visit regarding hypertension and frequent asymptomatic PVCs.  He is known to have refractory hypertension with no evidence of renal artery stenosis. He also suffers from chronic chest pain likely musculoskeletal with cardiac catheterization done twice in 2009 and 2011. Both times there was mild to moderate CAD without evidence of obstructive disease. He also has known history of sleep apnea does not tolerate CPAP well. He was first noted to have frequent PVCs more than 2 years ago during his sleep study and has been treated medically with a beta blocker. He did not tolerate the higher dose of carvedilol due to fatigue and bradycardia. Nuclear stress test in April of 2014 showed no evidence of ischemia. Echocardiogram 07/2012  showed normal LV systolic function with only mild left ventricular hypertrophy. 48 hour Holter monitor showed frequent and mostly monomorphic PVCs and very brief runs of supraventricular tachycardia. In total, there was 7965 PVC in 48 hours. He has been doing reasonably well and denies palpitations, dizziness or syncope. Dyspnea is stable. Blood pressure is reasonably controlled with no recent change in medications.   Allergies  Allergen Reactions  . Simvastatin     REACTION: Nausea     Current Outpatient Prescriptions on File Prior to Visit  Medication Sig Dispense Refill  . amLODipine (NORVASC) 10 MG tablet Take 15 mg by mouth daily.       [name] aspirin 81 MG tablet Take 81 mg by mouth daily.      . carvedilol (COREG) 12.5 MG tablet Take 12.5 mg by mouth 2 (two) times daily with a meal.      . celecoxib (CELEBREX) 200 MG capsule Take 200 mg by mouth daily as needed for pain.      [name] eplerenone (INSPRA) 50 MG tablet Take 50 mg by mouth daily.      [name] esomeprazole (NEXIUM) 40 MG capsule Take 40 mg by mouth as needed.       . ezetimibe (ZETIA) 10 MG tablet Take 10 mg  by mouth daily.      . hydrochlorothiazide (HYDRODIURIL) 25 MG tablet Take 25 mg by mouth daily.      [name] losartan-hydrochlorothiazide (HYZAAR) 100-25 MG per tablet Take 1 tablet by mouth daily.      . Omega-3 Fatty Acids (FISH OIL) 1000 MG CPDR Take 1,000 mg by mouth daily.      . Red Yeast Rice 600 MG CAPS Take 600 mg by mouth as needed.       . sertraline (ZOLOFT) 25 MG tablet Take 25 mg by mouth as needed.        No current facility-administered medications on file prior to visit.     Past Medical History  Diagnosis Date  . Esophageal reflux   . Unspecified disorder of kidney and ureter   . TIA (transient ischemic attack)   . Hypertension   . CAD (coronary artery disease)   . Asymptomatic PVCs   . Chronic kidney disease     Most recent creatinine was 1.59  . Pure hypercholesterolemia      Past Surgical History  Procedure Laterality Date  . Cardiac catheterization  2011    50% mid LAD stenosis, 40% proximal LCX, EF 60%  . Cardiac catheterization  2009    Mild CAD               Family History  Problem Relation  Age of Onset  . Heart attack Father     MI     History   Social History  . Marital Status: Married    Spouse Name: N/A    Number of Children: N/A  . Years of Education: N/A   Occupational History  . Not on file.   Social History Main Topics  . Smoking status: Never Smoker   . Smokeless tobacco: Not on file  . Alcohol Use: No     Comment: socail drinker  . Drug Use: No  . Sexual Activity: Not on file   Other Topics Concern  . Not on file   Social History Narrative  . No narrative on file       PHYSICAL EXAM   BP 148/90  Pulse 73  Ht 6\' 3"  (1.905 m)  Wt 275 lb (124.739 kg)  BMI 34.37 kg/m2 Constitutional: He is oriented to person, place, and time. He appears well-developed and well-nourished. No distress.  HENT: No nasal discharge.  Head: Normocephalic and atraumatic.  Eyes: Pupils are equal and round. Right eye exhibits no  discharge. Left eye exhibits no discharge.  Neck: Normal range of motion. Neck supple. No JVD present. No thyromegaly present.  Cardiovascular: Normal rate, regular rhythm, normal heart sounds and. Exam reveals no gallop and no friction rub. No murmur heard.  Pulmonary/Chest: Effort normal and breath sounds normal. No stridor. No respiratory distress. He has no wheezes. He has no rales. He exhibits no tenderness.  Abdominal: Soft. Bowel sounds are normal. He exhibits no distension. There is no tenderness. There is no rebound and no guarding.  Musculoskeletal: Normal range of motion. He exhibits no edema and no tenderness.  Neurological: He is alert and oriented to person, place, and time. Coordination normal.  Skin: Skin is warm and dry. No rash noted. He is not diaphoretic. No erythema. No pallor.  Psychiatric: He has a normal mood and affect. His behavior is normal. Judgment and thought content normal.    EKG: Sinus rhythm with sinus arrhythmia and occasional PVCs   ASSESSMENT AND PLAN

## 2013-04-04 NOTE — Patient Instructions (Signed)
Continue same medications.   Your physician wants you to follow-up in: 12 months.  You will receive a reminder letter in the mail two months in advance. If you don't receive a letter, please call our office to schedule the follow-up appointment.  

## 2013-04-05 NOTE — Assessment & Plan Note (Signed)
This was mild to moderate nonobstructive. Continue medical therapy.

## 2013-04-05 NOTE — Assessment & Plan Note (Signed)
Blood pressure is reasonably controlled on current medications. 

## 2013-04-05 NOTE — Assessment & Plan Note (Signed)
That does not seem to be increased further PVCs. Continue treatment with carvedilol and monitoring. Followup on a yearly basis.

## 2013-04-05 NOTE — Assessment & Plan Note (Signed)
He is intolerant to statins  and currently being treated with red yeast rice and Zetia.

## 2013-09-21 ENCOUNTER — Ambulatory Visit: Payer: Self-pay | Admitting: Gastroenterology

## 2013-09-21 DIAGNOSIS — E785 Hyperlipidemia, unspecified: Secondary | ICD-10-CM | POA: Insufficient documentation

## 2013-09-21 DIAGNOSIS — N183 Chronic kidney disease, stage 3 unspecified: Secondary | ICD-10-CM | POA: Insufficient documentation

## 2013-09-21 DIAGNOSIS — E669 Obesity, unspecified: Secondary | ICD-10-CM | POA: Insufficient documentation

## 2013-09-21 DIAGNOSIS — K219 Gastro-esophageal reflux disease without esophagitis: Secondary | ICD-10-CM | POA: Insufficient documentation

## 2013-09-24 LAB — PATHOLOGY REPORT

## 2014-05-12 ENCOUNTER — Ambulatory Visit: Payer: Medicare Other | Admitting: Cardiovascular Disease

## 2014-06-12 ENCOUNTER — Ambulatory Visit: Payer: Medicare Other | Admitting: Cardiovascular Disease

## 2014-06-12 ENCOUNTER — Ambulatory Visit (INDEPENDENT_AMBULATORY_CARE_PROVIDER_SITE_OTHER): Payer: Medicare Other | Admitting: Cardiovascular Disease

## 2014-06-12 ENCOUNTER — Encounter: Payer: Self-pay | Admitting: Cardiovascular Disease

## 2014-06-12 VITALS — BP 122/88 | HR 72 | Ht 75.0 in | Wt 281.0 lb

## 2014-06-12 DIAGNOSIS — I2510 Atherosclerotic heart disease of native coronary artery without angina pectoris: Secondary | ICD-10-CM

## 2014-06-12 DIAGNOSIS — I493 Ventricular premature depolarization: Secondary | ICD-10-CM | POA: Diagnosis not present

## 2014-06-12 DIAGNOSIS — I10 Essential (primary) hypertension: Secondary | ICD-10-CM

## 2014-06-12 DIAGNOSIS — E785 Hyperlipidemia, unspecified: Secondary | ICD-10-CM

## 2014-06-12 NOTE — Assessment & Plan Note (Signed)
Continue treatment with Zetia and red yeast Rice. Target LDL is less than 70.

## 2014-06-12 NOTE — Assessment & Plan Note (Signed)
These seem to be stable on current dose of carvedilol. He is overall asymptomatic.

## 2014-06-12 NOTE — Assessment & Plan Note (Signed)
Blood pressure is well controlled on current medications. He gets his labs checked with Dr. Rosario Jacks on a regular basis.

## 2014-06-12 NOTE — Patient Instructions (Signed)
Continue same medications.   Your physician wants you to follow-up in: 1 year.  You will receive a reminder letter in the mail two months in advance. If you don't receive a letter, please call our office to schedule the follow-up appointment.  

## 2014-06-12 NOTE — Assessment & Plan Note (Signed)
He has no symptoms of angina. Continue medical therapy. 

## 2014-06-12 NOTE — Progress Notes (Signed)
Primary care physician: Dr. Rosario Jacks  HPI  This is a pleasant 64 year old male who is here today for a followup visit regarding hypertension and frequent asymptomatic PVCs.  He is known to have refractory hypertension with no evidence of renal artery stenosis. He also suffers from chronic chest pain likely musculoskeletal with cardiac catheterization done twice in 2009 and 2011. Both times there was mild to moderate CAD without evidence of obstructive disease. He also has known history of sleep apnea does not tolerate CPAP well. He was first noted to have frequent PVCs more than 2 years ago during his sleep study and has been treated medically with a beta blocker. He did not tolerate the higher dose of carvedilol due to fatigue and bradycardia. Nuclear stress test in April of 2014 showed no evidence of ischemia. Echocardiogram 07/2012  showed normal LV systolic function with only mild left ventricular hypertrophy. 48 hour Holter monitor showed frequent and mostly monomorphic PVCs and very brief runs of supraventricular tachycardia. In total, there was 7965 PVC in 48 hours. He has been doing reasonably well and denies palpitations, dizziness or syncope. Dyspnea is stable. Blood pressure is well controlled with no recent change in medications.   Allergies  Allergen Reactions  . Simvastatin     REACTION: Nausea     Current Outpatient Prescriptions on File Prior to Visit  Medication Sig Dispense Refill  . amLODipine (NORVASC) 10 MG tablet Take 15 mg by mouth daily.     [name] aspirin 81 MG tablet Take 81 mg by mouth daily.    . celecoxib (CELEBREX) 200 MG capsule Take 200 mg by mouth daily as needed for pain.    [name] eplerenone (INSPRA) 50 MG tablet Take 50 mg by mouth daily.    [name] esomeprazole (NEXIUM) 40 MG capsule Take 40 mg by mouth as needed.     . ezetimibe (ZETIA) 10 MG tablet Take 10 mg by mouth daily.    . hydrochlorothiazide (HYDRODIURIL) 25 MG tablet Take 25 mg by mouth daily.    [name]  losartan-hydrochlorothiazide (HYZAAR) 100-25 MG per tablet Take 1 tablet by mouth daily.    . NON FORMULARY Michael's Blood Pressure Factors take 1 tablet daily.    . Red Yeast Rice 600 MG CAPS Take 600 mg by mouth as needed.     . sertraline (ZOLOFT) 25 MG tablet Take 25 mg by mouth as needed.     . sildenafil (VIAGRA) 50 MG tablet Take 50 mg by mouth daily as needed for erectile dysfunction.     No current facility-administered medications on file prior to visit.     Past Medical History  Diagnosis Date  . Esophageal reflux   . Unspecified disorder of kidney and ureter   . TIA (transient ischemic attack)   . Hypertension   . CAD (coronary artery disease)   . Asymptomatic PVCs   . Chronic kidney disease     Most recent creatinine was 1.59  . Pure hypercholesterolemia      Past Surgical History  Procedure Laterality Date  . Cardiac catheterization  2011    50% mid LAD stenosis, 40% proximal LCX, EF 60%  . Cardiac catheterization  2009    Mild CAD               Family History  Problem Relation Age of Onset  . Heart attack Father     MI     History   Social History  . Marital Status: Married  Spouse Name: N/A  . Number of Children: N/A  . Years of Education: N/A   Occupational History  . Not on file.   Social History Main Topics  . Smoking status: Never Smoker   . Smokeless tobacco: Not on file  . Alcohol Use: No     Comment: socail drinker  . Drug Use: No  . Sexual Activity: Not on file   Other Topics Concern  . Not on file   Social History Narrative       PHYSICAL EXAM   BP 122/88 mmHg  Pulse 72  Ht 6\' 3"  (1.905 m)  Wt 281 lb (127.461 kg)  BMI 35.12 kg/m2 Constitutional: He is oriented to person, place, and time. He appears well-developed and well-nourished. No distress.  HENT: No nasal discharge.  Head: Normocephalic and atraumatic.  Eyes: Pupils are equal and round. Right eye exhibits no discharge. Left eye exhibits no discharge.    Neck: Normal range of motion. Neck supple. No JVD present. No thyromegaly present.  Cardiovascular: Normal rate, regular rhythm, normal heart sounds and. Exam reveals no gallop and no friction rub. No murmur heard.  Pulmonary/Chest: Effort normal and breath sounds normal. No stridor. No respiratory distress. He has no wheezes. He has no rales. He exhibits no tenderness.  Abdominal: Soft. Bowel sounds are normal. He exhibits no distension. There is no tenderness. There is no rebound and no guarding.  Musculoskeletal: Normal range of motion. He exhibits no edema and no tenderness.  Neurological: He is alert and oriented to person, place, and time. Coordination normal.  Skin: Skin is warm and dry. No rash noted. He is not diaphoretic. No erythema. No pallor.  Psychiatric: He has a normal mood and affect. His behavior is normal. Judgment and thought content normal.    EKG: Sinus  Rhythm  - frequent multiform ectopic ventricular beats  # VECs = 3, # types 2 Low voltage in limb leads.   -  Nonspecific T-abnormality.   ABNORMAL     ASSESSMENT AND PLAN

## 2014-09-04 DIAGNOSIS — K5900 Constipation, unspecified: Secondary | ICD-10-CM | POA: Insufficient documentation

## 2014-10-05 ENCOUNTER — Ambulatory Visit (INDEPENDENT_AMBULATORY_CARE_PROVIDER_SITE_OTHER): Payer: Medicare Other | Admitting: Podiatry

## 2014-10-05 ENCOUNTER — Encounter: Payer: Self-pay | Admitting: Podiatry

## 2014-10-05 VITALS — BP 133/84 | HR 61 | Resp 16

## 2014-10-05 DIAGNOSIS — B353 Tinea pedis: Secondary | ICD-10-CM | POA: Diagnosis not present

## 2014-10-05 DIAGNOSIS — L603 Nail dystrophy: Secondary | ICD-10-CM | POA: Diagnosis not present

## 2014-10-05 DIAGNOSIS — I2510 Atherosclerotic heart disease of native coronary artery without angina pectoris: Secondary | ICD-10-CM | POA: Diagnosis not present

## 2014-10-05 MED ORDER — NAFTIFINE HCL 2 % EX GEL
1.0000 | Freq: Every day | CUTANEOUS | Status: DC
Start: 2014-10-05 — End: 2017-06-09

## 2014-10-05 NOTE — Progress Notes (Signed)
Subjective:     Patient ID: Samuel Mason, male   DOB: [birth year], 64 y.o.   MRN: [id number]  HPI  64 year old male presents the office today for concerns of thick, elongated toenails to both of his big nails. He states that there is not painful denies any redness or drainage from the sites. He states his wife is concerned with whether look. He also states that he has a rash in between his left fourth and fifth toes which itches intermittently. Denies any openings or drainage. He's had no prior treatment. No other complaints at this time.  Review of Systems  All other systems reviewed and are negative.      Objective:   Physical Exam AAO x3, NAD DP/PT pulses palpable bilaterally, CRT less than 3 seconds Protective sensation intact with Simms Weinstein monofilament, vibratory sensation intact, Achilles tendon reflex intact Bilateral hallux nails are hypertrophic, dystrophic, brittle, elongated, discolored and incurvated. There is no tenderness to palpation around the nails this time there is no surrounding erythema or drainage. Remaining nails are also slightly dystrophic and discolored however there is no tenderness and no surrounding erythema or drainage. On the left fourth interspace there is macerated tissue and the sulcus and then around it is dry, erythematous skin consistent with tinea pedis. There is also slight areas and the other interspaces as well however not significant. There is no drainage identified and there is no ascending synovitis or signs of clinical bacterial infection. No areas of tenderness to bilateral lower extremities. MMT 5/5, ROM WNL.  No open lesions or pre-ulcerative lesions.  No overlying edema, erythema, increase in warmth to bilateral lower extremities.  No pain with calf compression, swelling, warmth, erythema bilaterally.      Assessment:     64 year old male with bilateral hallux onychodystrophy, likely onychomycosis; tinea pedis    Plan:      -Treatment options discussed including all alternatives, risks, and complications -Bilateral hallux nails are sharply debrided without complications and they were sent for biopsy to [facility] labs. Discussed various treatment options for onychomycosis. If the result is positive for fungus will likely do topical treatment.  -Naftin gel for tinea -Follow up after nail biopsy results or sooner should any problems arise. In the meantime, encouraged to call with any questions/concerns/change in symptoms.

## 2014-10-17 ENCOUNTER — Ambulatory Visit: Payer: Self-pay | Admitting: Podiatry

## 2014-11-09 ENCOUNTER — Encounter: Payer: Self-pay | Admitting: Podiatry

## 2014-11-09 ENCOUNTER — Ambulatory Visit (INDEPENDENT_AMBULATORY_CARE_PROVIDER_SITE_OTHER): Payer: Medicare Other | Admitting: Podiatry

## 2014-11-09 VITALS — BP 159/91 | HR 67 | Resp 18

## 2014-11-09 DIAGNOSIS — I2510 Atherosclerotic heart disease of native coronary artery without angina pectoris: Secondary | ICD-10-CM

## 2014-11-09 DIAGNOSIS — B351 Tinea unguium: Secondary | ICD-10-CM

## 2014-11-09 DIAGNOSIS — B353 Tinea pedis: Secondary | ICD-10-CM | POA: Diagnosis not present

## 2014-11-09 MED ORDER — KETOCONAZOLE 2 % EX CREA: 1.0000 "application " | TOPICAL_CREAM | Freq: Every day | CUTANEOUS | Status: DC

## 2014-11-09 MED ORDER — CICLOPIROX 8 % EX SOLN: Freq: Every day | CUTANEOUS | Status: DC

## 2014-11-09 NOTE — Progress Notes (Signed)
Patient ID: Samuel Mason, male   DOB: [birth year], 64 y.o.   MRN: [id number]  Subjective: 64 year old male presents after discussed nail biopsy results. He states significantly get the cream for athlete's foot after last appointment due to cost he is asking for an alternative medication. He denies any redness or drainage from all nail sites. No other complaints at this time in no acute changes since last appointment.  Objective: AAO 3, NAD Neurovascular status unchanged Bilateral hallux nails hypertrophic, dystrophic, brittle, elongated, discolored. There is no tenderness to palpation around the nails there is no swelling erythema or drainage. There is slight amount of dry, peeling, scaly skin with a slight erythematous base of plantar aspect of the feet consistent with tinea pedis. There is no open lesions or pre-ulcerative lesions. No other areas of tenderness to bilateral lower extremities. No open lesions or pre-ulcerative lesions No pain with calf compression, swelling, warmth, erythema  Assessment: 64 year old male with onychomycosis, tinea pedis  Plan: Discussed nail biopsy results of the patient. After discussion of treatment options she was to proceed with topical treatment. We will start with Penlac which was prescribed to him today. Also prescribed ketoconazole for athlete's foot. Follow-up as needed. Call with questions or concerns in the meantime.  Celesta Gentile, DPM

## 2015-07-30 ENCOUNTER — Ambulatory Visit (INDEPENDENT_AMBULATORY_CARE_PROVIDER_SITE_OTHER): Payer: Medicare Other | Admitting: Cardiovascular Disease

## 2015-07-30 ENCOUNTER — Telehealth: Payer: Self-pay | Admitting: Cardiovascular Disease

## 2015-07-30 ENCOUNTER — Encounter: Payer: Self-pay | Admitting: Cardiovascular Disease

## 2015-07-30 VITALS — BP 120/80 | HR 59 | Ht 75.0 in | Wt 286.0 lb

## 2015-07-30 DIAGNOSIS — I493 Ventricular premature depolarization: Secondary | ICD-10-CM

## 2015-07-30 DIAGNOSIS — I2510 Atherosclerotic heart disease of native coronary artery without angina pectoris: Secondary | ICD-10-CM | POA: Diagnosis not present

## 2015-07-30 NOTE — Progress Notes (Signed)
Cardiology Office Note   Date:  07/30/2015   ID:  Samuel Mason, DOB 23-Aug-1950, MRN [id number]  PCP:  [name], MD  Cardiologist:   [name], MD   No chief complaint on file.     History of Present Illness: Samuel Mason is a 65 y.o. male who presents for a followup visit regarding hypertension and frequent asymptomatic PVCs.  He is known to have refractory hypertension with no evidence of renal artery stenosis. He also suffers from chronic chest pain likely musculoskeletal with cardiac catheterization done twice in 2009 and 2011. Both times there was mild to moderate CAD without evidence of obstructive disease. He also has known history of sleep apnea does not tolerate CPAP well. He was first noted to have frequent PVCs more than 2 years ago during his sleep study and has been treated medically with a beta blocker. He did not tolerate the higher dose of carvedilol due to fatigue and bradycardia. Echocardiogram 07/2012  showed normal LV systolic function with only mild left ventricular hypertrophy. 48 hour Holter monitor showed frequent and mostly monomorphic PVCs and very brief runs of supraventricular tachycardia. In total, there was 7965 PVC in 48 hours. He has been doing reasonably well and denies palpitations, dizziness or syncope. Dyspnea is stable. Blood pressure is well controlled with no recent change in medications.  He underwent a recent treadmill nuclear stress test with Dr.Jadali  which showed no evidence of ischemia with normal ejection fraction. However, he was noted to have frequent PVCs with exercise. The patient denies any palpitations.    Past Medical History  Diagnosis Date  . Esophageal reflux   . Unspecified disorder of kidney and ureter   . TIA (transient ischemic attack)   . Hypertension   . CAD (coronary artery disease)   . Asymptomatic PVCs   . Chronic kidney disease     Most recent creatinine was 1.59  . Pure hypercholesterolemia     Past  Surgical History  Procedure Laterality Date  . Cardiac catheterization  2011    50% mid LAD stenosis, 40% proximal LCX, EF 60%  . Cardiac catheterization  2009    Mild CAD               Current Outpatient Prescriptions  Medication Sig Dispense Refill  . amLODipine (NORVASC) 10 MG tablet Take 15 mg by mouth daily.     [name] aspirin 81 MG tablet Take 81 mg by mouth daily.    . carvedilol (COREG) 6.25 MG tablet Take 6.25 mg by mouth 2 (two) times daily with a meal.    . celecoxib (CELEBREX) 200 MG capsule Take 200 mg by mouth daily as needed for pain.    . ciclopirox (PENLAC) 8 % solution Apply topically at bedtime. Apply over nail and surrounding skin. Apply daily over previous coat. After seven (7) days, may remove with alcohol and continue cycle. 6.6 mL 5  . eplerenone (INSPRA) 50 MG tablet Take 50 mg by mouth daily.    [name] esomeprazole (NEXIUM) 40 MG capsule Take 40 mg by mouth as needed.     . ezetimibe (ZETIA) 10 MG tablet Take 10 mg by mouth daily.    [name] ketoconazole (NIZORAL) 2 % cream Apply 1 application topically daily. 60 g 2  . losartan-hydrochlorothiazide (HYZAAR) 100-25 MG per tablet Take 1 tablet by mouth daily.    . Naftifine HCl (NAFTIN) 2 % GEL Apply 1 application topically daily. 60 g 2  .  Omega-3 Fatty Acids (FISH OIL PO) Take 350 mg by mouth daily.    . Red Yeast Rice 600 MG CAPS Take 600 mg by mouth as needed.      No current facility-administered medications for this visit.    Allergies:   Simvastatin    Social History:  The patient  reports that he has never smoked. He has never used smokeless tobacco. He reports that he does not drink alcohol or use illicit drugs.   Family History:  The patient's family history includes Heart attack in his father.    ROS:  Please see the history of present illness.   Otherwise, review of systems are positive for none.   All other systems are reviewed and negative.    PHYSICAL EXAM: VS:  There were no vitals taken for this visit.  , BMI There is no weight on file to calculate BMI. GEN: Well nourished, well developed, in no acute distress HEENT: normal Neck: no JVD, carotid bruits, or masses Cardiac: RRR; no murmurs, rubs, or gallops,no edema  Respiratory:  clear to auscultation bilaterally, normal work of breathing GI: soft, nontender, nondistended, + BS MS: no deformity or atrophy Skin: warm and dry, no rash Neuro:  Strength and sensation are intact Psych: euthymic mood, full affect   EKG:  EKG is ordered today. The ekg ordered today demonstrates Normal sinus rhythm with one PVC, incomplete left bundle branch block and nonspecific T wave changes.  Recent Labs: No results found for requested labs within last 365 days.    Lipid Panel    Component Value Date/Time   CHOL 234* 06/22/2006 1553   TRIG 91 06/22/2006 1553   HDL 36.4* 06/22/2006 1553   CHOLHDL 6.4 CALC 06/22/2006 1553   VLDL 18 06/22/2006 1553   LDLDIRECT 183.4 06/22/2006 1553      Wt Readings from Last 3 Encounters:  06/12/14 281 lb (127.461 kg)  04/04/13 275 lb (124.739 kg)  08/19/12 270 lb 12 oz (122.811 kg)      Other studies Reviewed: Additional studies/ records that were reviewed today include:  Records from his primary care physician. Review of the above records demonstrates: Most recent creatinine was 1.85   ASSESSMENT AND PLAN:  1. Asymptomatic PVCs: The patient has prolonged history of this and overall he is asymptomatic. During recent nuclear stress test, he was noted to have frequent PVCs with exercise. I requested a 24-hour Holter monitor to ensure that PVC burden has not increased from before. If he has less than 6000 PVCs in 24 hours, no further adjustments in his medications as needed.    2. Refractory hypertension: Blood pressure is well controlled on current medications.    3. Hyperlipidemia: Most recent lipid profile in February showed a total cholesterol of 207, HDL of 39, triglyceride of 103 and an LDL of 147. He  was started on rosuvastatin. I recommend a target LDL of less than 70.   4. Coronary atherosclerosis without angina: He was found to have mild nonobstructive coronary artery disease and previous cardiac catheterization. Recent nuclear stress test showed no evidence of ischemia. Continue medical therapy.    Disposition:   FU with me in 1 year  Signed,  [name], MD  07/30/2015 3:03 PM    [facility]

## 2015-07-30 NOTE — Patient Instructions (Addendum)
Medication Instructions:  Your physician recommends that you continue on your current medications as directed. Please refer to the Current Medication list given to you today.   Labwork: none  Testing/Procedures: Your physician has recommended that you wear a holter monitor. Holter monitors are medical devices that record the heart's electrical activity. Doctors most often use these monitors to diagnose arrhythmias. Arrhythmias are problems with the speed or rhythm of the heartbeat. The monitor is a small, portable device. You can wear one while you do your normal daily activities. This is usually used to diagnose what is causing palpitations/syncope (passing out).    Follow-Up: Your physician wants you to follow-up in: one year with Dr. Fletcher Anon.  You will receive a reminder letter in the mail two months in advance. If you don't receive a letter, please call our office to schedule the follow-up appointment.   Any Other Special Instructions Will Be Listed Below (If Applicable).     If you need a refill on your cardiac medications before your next appointment, please call your pharmacy.  Holter Monitoring A Holter monitor is a small device that is used to detect abnormal heart rhythms. It clips to your clothing and is connected by wires to flat, sticky disks (electrodes) that attach to your chest. It is worn continuously for 24-48 hours. HOME CARE INSTRUCTIONS  Wear your Holter monitor at all times, even while exercising and sleeping, for as long as directed by your health care provider.  Make sure that the Holter monitor is safely clipped to your clothing or close to your body as recommended by your health care provider.  Do not get the monitor or wires wet.  Do not put body lotion or moisturizer on your chest.  Keep your skin clean.  Keep a diary of your daily activities, such as walking and doing chores. If you feel that your heartbeat is abnormal or that your heart is fluttering  or skipping a beat:  Record what you are doing when it happens.  Record what time of day the symptoms occur.  Return your Holter monitor as directed by your health care provider.  Keep all follow-up visits as directed by your health care provider. This is important. SEEK IMMEDIATE MEDICAL CARE IF:  You feel lightheaded or you faint.  You have trouble breathing.  You feel pain in your chest, upper arm, or jaw.  You feel sick to your stomach and your skin is pale, cool, or damp.  You heartbeat feels unusual or abnormal.   This information is not intended to replace advice given to you by your health care provider. Make sure you discuss any questions you have with your health care provider.   Document Released: 01/04/2004 Document Revised: 04/28/2014 Document Reviewed: 11/14/2013 Elsevier Interactive Patient Education [employer].

## 2015-07-30 NOTE — Telephone Encounter (Signed)
Dr Rosario Jacks calling asking if Dr Fletcher Anon can call him back Has question about patient, not sure if patient needs to come in today.  Please advise

## 2015-07-30 NOTE — Telephone Encounter (Signed)
S/w Devon who states Dr. Rosario Jacks would like to speak to Dr. Fletcher Anon before pt OV today w/Arida. Advised Devon that he is in the hospital and will be back as clinic begins this afternoon. States their office faxed pt results from stress test. We have results and Fletcher Anon will review before OV. Will make MD aware.

## 2015-08-13 ENCOUNTER — Ambulatory Visit (INDEPENDENT_AMBULATORY_CARE_PROVIDER_SITE_OTHER): Payer: Medicare Other

## 2015-08-13 DIAGNOSIS — I493 Ventricular premature depolarization: Secondary | ICD-10-CM | POA: Diagnosis not present

## 2015-08-17 ENCOUNTER — Ambulatory Visit: Payer: Medicare Other | Attending: Cardiovascular Disease

## 2015-08-17 DIAGNOSIS — I493 Ventricular premature depolarization: Secondary | ICD-10-CM | POA: Diagnosis not present

## 2015-08-21 ENCOUNTER — Telehealth: Payer: Self-pay | Admitting: Cardiovascular Disease

## 2015-08-21 NOTE — Telephone Encounter (Signed)
Wants holter monitor results .  Also still has red skin from where monitor was placed and wants to know what to use to clear.  Please call tomorrow as patient is busy today.

## 2015-08-22 NOTE — Telephone Encounter (Signed)
S/w pt regarding results. See result note.

## 2016-08-28 ENCOUNTER — Ambulatory Visit (INDEPENDENT_AMBULATORY_CARE_PROVIDER_SITE_OTHER): Payer: Medicare Other | Admitting: Cardiovascular Disease

## 2016-08-28 ENCOUNTER — Encounter: Payer: Self-pay | Admitting: Cardiovascular Disease

## 2016-08-28 VITALS — BP 130/82 | HR 70 | Ht 75.0 in | Wt 274.2 lb

## 2016-08-28 DIAGNOSIS — I493 Ventricular premature depolarization: Secondary | ICD-10-CM | POA: Diagnosis not present

## 2016-08-28 DIAGNOSIS — I2510 Atherosclerotic heart disease of native coronary artery without angina pectoris: Secondary | ICD-10-CM

## 2016-08-28 DIAGNOSIS — E785 Hyperlipidemia, unspecified: Secondary | ICD-10-CM | POA: Diagnosis not present

## 2016-08-28 DIAGNOSIS — I10 Essential (primary) hypertension: Secondary | ICD-10-CM | POA: Diagnosis not present

## 2016-08-28 NOTE — Patient Instructions (Addendum)
Medication Instructions:  Your physician recommends that you continue on your current medications as directed. Please refer to the Current Medication list given to you today.   Labwork: none  Testing/Procedures: Your physician has requested that you have an echocardiogram. Echocardiography is a painless test that uses sound waves to create images of your heart. It provides your doctor with information about the size and shape of your heart and how well your heart's chambers and valves are working. This procedure takes approximately one hour. There are no restrictions for this procedure.    Follow-Up: Your physician wants you to follow-up in: one year with Dr. Arida.  You will receive a reminder letter in the mail two months in advance. If you don't receive a letter, please call our office to schedule the follow-up appointment.   Any Other Special Instructions Will Be Listed Below (If Applicable).     If you need a refill on your cardiac medications before your next appointment, please call your pharmacy.  Echocardiogram An echocardiogram, or echocardiography, uses sound waves (ultrasound) to produce an image of your heart. The echocardiogram is simple, painless, obtained within a short period of time, and offers valuable information to your health care provider. The images from an echocardiogram can provide information such as:  Evidence of coronary artery disease (CAD).  Heart size.  Heart muscle function.  Heart valve function.  Aneurysm detection.  Evidence of a past heart attack.  Fluid buildup around the heart.  Heart muscle thickening.  Assess heart valve function. Tell a health care provider about:  Any allergies you have.  All medicines you are taking, including vitamins, herbs, eye drops, creams, and over-the-counter medicines.  Any problems you or family members have had with anesthetic medicines.  Any blood disorders you have.  Any surgeries you have  had.  Any medical conditions you have.  Whether you are pregnant or may be pregnant. What happens before the procedure? No special preparation is needed. Eat and drink normally. What happens during the procedure?  In order to produce an image of your heart, gel will be applied to your chest and a wand-like tool (transducer) will be moved over your chest. The gel will help transmit the sound waves from the transducer. The sound waves will harmlessly bounce off your heart to allow the heart images to be captured in real-time motion. These images will then be recorded.  You may need an IV to receive a medicine that improves the quality of the pictures. What happens after the procedure? You may return to your normal schedule including diet, activities, and medicines, unless your health care provider tells you otherwise. This information is not intended to replace advice given to you by your health care provider. Make sure you discuss any questions you have with your health care provider. Document Released: 04/04/2000 Document Revised: 11/24/2015 Document Reviewed: 12/13/2012 Elsevier Interactive Patient Education  2017 Elsevier Inc.  

## 2016-08-28 NOTE — Progress Notes (Signed)
Cardiology Office Note   Date:  08/28/2016   ID:  MOHAMADOU MACIVER, DOB 16-Feb-1951, MRN [id number]  PCP:  [name]  Cardiologist:   [name], MD   Chief Complaint  Patient presents with  . other    12 month follow up. Patient states he has been doing well. Meds reviewed verbally with patient.       History of Present Illness: Samuel Mason is a 66 y.o. male who presents for a followup visit regarding hypertension and frequent asymptomatic PVCs.  He is known to have refractory hypertension with no evidence of renal artery stenosis. He also suffers from chronic chest pain likely musculoskeletal with cardiac catheterization done twice in 2009 and 2011. Both times there was mild to moderate CAD without evidence of obstructive disease. He also has known history of sleep apnea does not tolerate CPAP well. He was first noted to have frequent PVCs more than 2 years ago during his sleep study and has been treated medically with a beta blocker. He did not tolerate the higher dose of carvedilol due to fatigue and bradycardia. Echocardiogram 07/2012  showed normal LV systolic function with only mild left ventricular hypertrophy. 48 hour Holter monitor showed frequent and mostly monomorphic PVCs and very brief runs of supraventricular tachycardia. In total, there was 7965 PVC in 48 hours. He had repeat Holter monitor last year which showed stable amount of PVCs.  He has been doing well with no chest pain, shortness of breath or palpitations. No dizziness. He has been exercising regularly at the gym with no limitations. Blood pressure has been well-controlled.  Past Medical History:  Diagnosis Date  . Asymptomatic PVCs   . CAD (coronary artery disease)   . Chronic kidney disease    Most recent creatinine was 1.59  . Esophageal reflux   . Hypertension   . Pure hypercholesterolemia   . TIA (transient ischemic attack)   . Unspecified disorder of kidney and ureter     Past Surgical  History:  Procedure Laterality Date  . CARDIAC CATHETERIZATION  2011   50% mid LAD stenosis, 40% proximal LCX, EF 60%  . CARDIAC CATHETERIZATION  2009   Mild CAD               Current Outpatient Prescriptions  Medication Sig Dispense Refill  . amLODipine (NORVASC) 10 MG tablet Take 15 mg by mouth daily.     [name] aspirin 81 MG tablet Take 81 mg by mouth daily.    . carvedilol (COREG) 6.25 MG tablet Take 6.25 mg by mouth 2 (two) times daily with a meal.    . celecoxib (CELEBREX) 200 MG capsule Take 200 mg by mouth daily as needed for pain.    . ciclopirox (PENLAC) 8 % solution Apply topically at bedtime. Apply over nail and surrounding skin. Apply daily over previous coat. After seven (7) days, may remove with alcohol and continue cycle. 6.6 mL 5  . CRESTOR 10 MG tablet Take 10 mg by mouth daily.  3  . eplerenone (INSPRA) 50 MG tablet Take 50 mg by mouth daily.    [name] esomeprazole (NEXIUM) 40 MG capsule Take 40 mg by mouth as needed.     [name] ketoconazole (NIZORAL) 2 % cream Apply 1 application topically daily. 60 g 2  . losartan-hydrochlorothiazide (HYZAAR) 100-25 MG per tablet Take 1 tablet by mouth daily.    . Naftifine HCl (NAFTIN) 2 % GEL Apply 1 application topically daily. 60 g 2  .  Omega-3 Fatty Acids (FISH OIL PO) Take 350 mg by mouth daily.     No current facility-administered medications for this visit.     Allergies:   Simvastatin    Social History:  The patient  reports that he has never smoked. He has never used smokeless tobacco. He reports that he does not drink alcohol or use drugs.   Family History:  The patient's family history includes Heart attack in his father.    ROS:  Please see the history of present illness.   Otherwise, review of systems are positive for none.   All other systems are reviewed and negative.    PHYSICAL EXAM: VS:  BP 130/82 (BP Location: Left Arm, Patient Position: Sitting, Cuff Size: Normal)   Pulse 70   Ht 6\' 3"  (1.905 m)   Wt 274 lb 4 oz  (124.4 kg)   BMI 34.28 kg/m  , BMI Body mass index is 34.28 kg/m. GEN: Well nourished, well developed, in no acute distress  HEENT: normal  Neck: no JVD, carotid bruits, or masses Cardiac: RRR with premature beats; no murmurs, rubs, or gallops,no edema  Respiratory:  clear to auscultation bilaterally, normal work of breathing GI: soft, nontender, nondistended, + BS MS: no deformity or atrophy  Skin: warm and dry, no rash Neuro:  Strength and sensation are intact Psych: euthymic mood, full affect   EKG:  EKG is ordered today. The ekg ordered today demonstrates Normal sinus rhythm with  frequent PVCs, incomplete left bundle branch block and nonspecific T wave changes.  Recent Labs: No results found for requested labs within last 8760 hours.    Lipid Panel    Component Value Date/Time   CHOL 234 (HH) 06/22/2006 1553   TRIG 91 06/22/2006 1553   HDL 36.4 (L) 06/22/2006 1553   CHOLHDL 6.4 CALC 06/22/2006 1553   VLDL 18 06/22/2006 1553   LDLDIRECT 183.4 06/22/2006 1553      Wt Readings from Last 3 Encounters:  08/28/16 274 lb 4 oz (124.4 kg)  07/30/15 286 lb (129.7 kg)  06/12/14 281 lb (127.5 kg)         ASSESSMENT AND PLAN:  1. Asymptomatic PVCs:  The patient continues to have PVCs as noted above his EKG. Fortunately, he is not symptomatic. Continue treatment with carvedilol. I do think we have to make sure his LV systolic function remains normal. Thus, I requested an echocardiogram.  2. Refractory hypertension: Blood pressure is well controlled on current medications.   3. Hyperlipidemia: Continue treatment with rosuvastatin with a target LDL of less than 70. This is followed by Dr. Rosario Jacks.   4. Coronary atherosclerosis without angina: Mild nonobstructive coronary artery disease on previous cardiac catheterization. No anginal symptoms. Continue medical therapy.    Disposition:   FU with me in 1 year  Signed,  [name], MD  08/28/2016 2:29 PM    [place]

## 2016-09-23 ENCOUNTER — Other Ambulatory Visit: Payer: Self-pay

## 2016-09-23 ENCOUNTER — Ambulatory Visit (INDEPENDENT_AMBULATORY_CARE_PROVIDER_SITE_OTHER): Payer: Medicare Other

## 2016-09-23 DIAGNOSIS — I493 Ventricular premature depolarization: Secondary | ICD-10-CM

## 2016-09-23 LAB — ECHOCARDIOGRAM COMPLETE
AO mean calculated velocity dopler: 91 cm/s
AOASC: 28 cm
AV Area VTI index: 1.26 cm2/m2
AV Mean grad: 4 mmHg
AVAREAMEANV: 2.54 cm2
AVAREAMEANVIN: 1.01 cm2/m2
AVCELMEANRAT: 0.73
CHL CUP AV VALUE AREA INDEX: 1.26
CHL CUP AV VEL: 3.16
CHL CUP DOP CALC LVOT VTI: 24.5 cm
E/e' ratio: 7.59
EWDT: 201 ms
FS: 49 % — AB (ref 28–44)
IV/PV OW: 1.1
LA diam end sys: 40 mm
LA diam index: 1.59 cm/m2
LA vol A4C: 67.9 ml
LA vol: 83.7 mL
LASIZE: 40 mm
LAVOLIN: 33.3 mL/m2
LDCA: 3.46 cm2
LV PW d: 10 mm — AB (ref 0.6–1.1)
LV TDI E'LATERAL: 10.6
LV e' LATERAL: 10.6 cm/s
LVEEAVG: 7.59
LVEEMED: 7.59
LVOT SV: 85 mL
LVOTD: 21 mm
LVOTVTI: 0.91 cm
Lateral S' vel: 13.5 cm/s
MV Dec: 201
MV Peak grad: 3 mmHg
MVAP: 3.73 cm2
MVPKAVEL: 56.3 m/s
MVPKEVEL: 80.5 m/s
MVSPHT: 59 ms
RV TAPSE: 24.5 mm
TDI e' medial: 6.74
VTI: 26.8 cm
Valve area: 3.16 cm2

## 2016-10-02 ENCOUNTER — Other Ambulatory Visit: Payer: Medicare Other

## 2016-10-17 ENCOUNTER — Other Ambulatory Visit: Payer: Self-pay | Admitting: Internal Medicine

## 2016-10-17 ENCOUNTER — Other Ambulatory Visit: Payer: Self-pay | Admitting: Cardiovascular Disease

## 2016-10-17 DIAGNOSIS — Z136 Encounter for screening for cardiovascular disorders: Secondary | ICD-10-CM

## 2016-10-23 ENCOUNTER — Ambulatory Visit: Payer: Medicare Other

## 2016-10-30 ENCOUNTER — Ambulatory Visit: Payer: Medicare Other

## 2016-11-19 ENCOUNTER — Ambulatory Visit: Payer: Medicare Other

## 2016-12-16 ENCOUNTER — Telehealth: Payer: Self-pay | Admitting: Cardiovascular Disease

## 2016-12-16 NOTE — Telephone Encounter (Signed)
err

## 2016-12-17 ENCOUNTER — Ambulatory Visit (INDEPENDENT_AMBULATORY_CARE_PROVIDER_SITE_OTHER): Payer: Medicare Other

## 2016-12-17 ENCOUNTER — Ambulatory Visit (INDEPENDENT_AMBULATORY_CARE_PROVIDER_SITE_OTHER): Payer: Self-pay

## 2016-12-17 ENCOUNTER — Other Ambulatory Visit: Payer: Self-pay

## 2016-12-17 VITALS — BP 140/86 | HR 55 | Resp 16

## 2016-12-17 DIAGNOSIS — I493 Ventricular premature depolarization: Secondary | ICD-10-CM

## 2016-12-17 DIAGNOSIS — I10 Essential (primary) hypertension: Secondary | ICD-10-CM

## 2016-12-17 DIAGNOSIS — Z136 Encounter for screening for cardiovascular disorders: Secondary | ICD-10-CM

## 2016-12-17 LAB — VAS US VASCUSCREEN
LCCADDIAS: -18 cm/s
LCCADSYS: -61 cm/s
LICAPSYS: -43 cm/s
Left ICA prox dias: -13 cm/s

## 2016-12-17 NOTE — Patient Instructions (Signed)
1.) Reason for visit: EKG, VS  2.) Name of MD requesting visit:   3.) H&P: Pt has history of PVCs, HTN, hyperlipidemia, CAD.   4.) ROS related to problem: Pt in office today for vascular screening. Notified by Pilar Jarvis that pt's HR fluctuating upper 20s-70s. Pt asymptomatic. He has known hx of PVCs as documented with holter monitor in 2017.  BP slightly elevated however, he has not taken any of his daily medications as he wanted to "see what the test would be without them" .   His main concern today is right facial burning and numbness. [facility] is now unable to fill his amlodipine so he had it filled at [facility]. Took one Saturday then walked on the treadmill. At that time, he experienced the facial numbness. He drank lots of water to try and "flush the amlodipine out of my system". Sx resolved in two hours. He still had some old amlodipine left from [facility] which he then took Sunday and Monday. No numbness or burning sensation. Tuesday, he took the [facility]ine and sx returned. He feels sx are r/t the new amlodipine. He intended to call Dr. Rosario Jacks, prescriber,  to ask for another BP medication as he does not think he can tolerate the new amlodipine. Now would like to know if Dr. Fletcher Anon would prescribe an alternate med.  Pt denies any other sx. No facial drooping, slurred speech, stating he is aware of TIA/stroke signs.   5.) Assessment and plan per MD: EKG reviewed by Dr. Rockey Situ. Pt will continue medications as prescribed and will route to Dr. Fletcher Anon.

## 2016-12-18 ENCOUNTER — Telehealth: Payer: Self-pay | Admitting: Cardiovascular Disease

## 2016-12-18 NOTE — Progress Notes (Signed)
He has known history of PVCs. This is not new. He should try to get the same brand amlodipine that he was on.

## 2016-12-18 NOTE — Telephone Encounter (Signed)
Left detailed VM for patient to try and find same brand of amlodipine as he was taking. Provided call back number if questions.

## 2017-02-20 ENCOUNTER — Telehealth: Payer: Self-pay | Admitting: Cardiovascular Disease

## 2017-02-20 NOTE — Telephone Encounter (Signed)
Pt reported at his last OV that he takes amlodipine 15mg  qd. Dr. Fletcher Anon did not prescribe medication nor does he provide refills. Notified Angie that we only know what patient reported to [place].

## 2017-02-20 NOTE — Telephone Encounter (Signed)
PCP office calling to confirm dosage of Norvasc

## 2017-06-09 ENCOUNTER — Ambulatory Visit (INDEPENDENT_AMBULATORY_CARE_PROVIDER_SITE_OTHER): Payer: Medicare Other | Admitting: Nurse Practitioner

## 2017-06-09 ENCOUNTER — Encounter: Payer: Self-pay | Admitting: Nurse Practitioner

## 2017-06-09 VITALS — BP 138/92 | HR 64 | Ht 75.0 in | Wt 278.5 lb

## 2017-06-09 DIAGNOSIS — I25119 Atherosclerotic heart disease of native coronary artery with unspecified angina pectoris: Secondary | ICD-10-CM | POA: Diagnosis not present

## 2017-06-09 DIAGNOSIS — N183 Chronic kidney disease, stage 3 unspecified: Secondary | ICD-10-CM

## 2017-06-09 DIAGNOSIS — G4733 Obstructive sleep apnea (adult) (pediatric): Secondary | ICD-10-CM

## 2017-06-09 DIAGNOSIS — E782 Mixed hyperlipidemia: Secondary | ICD-10-CM

## 2017-06-09 DIAGNOSIS — R0789 Other chest pain: Secondary | ICD-10-CM | POA: Diagnosis not present

## 2017-06-09 DIAGNOSIS — K219 Gastro-esophageal reflux disease without esophagitis: Secondary | ICD-10-CM | POA: Diagnosis not present

## 2017-06-09 DIAGNOSIS — I10 Essential (primary) hypertension: Secondary | ICD-10-CM

## 2017-06-09 DIAGNOSIS — I493 Ventricular premature depolarization: Secondary | ICD-10-CM

## 2017-06-09 NOTE — Patient Instructions (Addendum)
Medication Instructions:  Your physician recommends that you continue on your current medications as directed. Please refer to the Current Medication list given to you today.   Labwork: none  Testing/Procedures: Your physician has requested that you have en exercise stress myoview. For further information please visit [email or web address]. Please follow instruction sheet, as given.   [place]  Your caregiver has ordered a Stress Test with nuclear imaging. The purpose of this test is to evaluate the blood supply to your heart muscle. This procedure is referred to as a "Non-Invasive Stress Test." This is because other than having an IV started in your vein, nothing is inserted or "invades" your body. Cardiac stress tests are done to find areas of poor blood flow to the heart by determining the extent of coronary artery disease (CAD). Some patients exercise on a treadmill, which naturally increases the blood flow to your heart, while others who are  unable to walk on a treadmill due to physical limitations have a pharmacologic/chemical stress agent called Lexiscan . This medicine will mimic walking on a treadmill by temporarily increasing your coronary blood flow.   Please note: these test may take anywhere between 2-4 hours to complete  PLEASE REPORT TO [place] AT THE FIRST DESK WILL DIRECT YOU WHERE TO GO  Date of Procedure:______02/21/2019________  Arrival Time for Procedure:_______08:45 AM___________  Instructions regarding medication:   __XX_:  Hold betablocker (CARVEDILOL) night before procedure and morning of procedure   PLEASE NOTIFY THE OFFICE AT LEAST 24 HOURS IN ADVANCE IF YOU ARE UNABLE TO KEEP YOUR APPOINTMENT.  [phone] AND  PLEASE NOTIFY NUCLEAR MEDICINE AT [facility] AT LEAST 24 HOURS IN ADVANCE IF YOU ARE UNABLE TO KEEP YOUR APPOINTMENT. [phone]  How to prepare for your Myoview test:  1. Do not eat or drink after  midnight 2. No caffeine for 24 hours prior to test 3. No smoking 24 hours prior to test. 4. Your medication may be taken with water.  If your doctor stopped a medication because of this test, do not take that medication. 5. Ladies, please do not wear dresses.  Skirts or pants are appropriate. Please wear a short sleeve shirt. 6. No perfume, cologne or lotion. 7. Wear comfortable walking shoes. No heels!       Follow-Up: Your physician recommends that you schedule a follow-up appointment in: [place].    If you need a refill on your cardiac medications before your next appointment, please call your pharmacy.   Cardiac Nuclear Scan A cardiac nuclear scan is a test that measures blood flow to the heart when a person is resting and when he or she is exercising. The test looks for problems such as:  Not enough blood reaching a portion of the heart.  The heart muscle not working normally.  You may need this test if:  You have heart disease.  You have had abnormal lab results.  You have had heart surgery or angioplasty.  You have chest pain.  You have shortness of breath.  In this test, a radioactive dye (tracer) is injected into your bloodstream. After the tracer has traveled to your heart, an imaging device is used to measure how much of the tracer is absorbed by or distributed to various areas of your heart. This procedure is usually done at a hospital and takes 2-4 hours. Tell a health care provider about:  Any allergies you have.  All medicines you are  taking, including vitamins, herbs, eye drops, creams, and over-the-counter medicines.  Any problems you or family members have had with the use of anesthetic medicines.  Any blood disorders you have.  Any surgeries you have had.  Any medical conditions you have.  Whether you are pregnant or may be pregnant. What are the risks? Generally, this is a safe procedure. However, problems may occur,  including:  Serious chest pain and heart attack. This is only a risk if the stress portion of the test is done.  Rapid heartbeat.  Sensation of warmth in your chest. This usually passes quickly.  What happens before the procedure?  Ask your health care provider about changing or stopping your regular medicines. This is especially important if you are taking diabetes medicines or blood thinners.  Remove your jewelry on the day of the procedure. What happens during the procedure?  An IV tube will be inserted into one of your veins.  Your health care provider will inject a small amount of radioactive tracer through the tube.  You will wait for 20-40 minutes while the tracer travels through your bloodstream.  Your heart activity will be monitored with an electrocardiogram (ECG).  You will lie down on an exam table.  Images of your heart will be taken for about 15-20 minutes.  You may be asked to exercise on a treadmill or stationary bike. While you exercise, your heart's activity will be monitored with an ECG, and your blood pressure will be checked. If you are unable to exercise, you may be given a medicine to increase blood flow to parts of your heart.  When blood flow to your heart has peaked, a tracer will again be injected through the IV tube.  After 20-40 minutes, you will get back on the exam table and have more images taken of your heart.  When the procedure is over, your IV tube will be removed. The procedure may vary among health care providers and hospitals. Depending on the type of tracer used, scans may need to be repeated 3-4 hours later. What happens after the procedure?  Unless your health care provider tells you otherwise, you may return to your normal schedule, including diet, activities, and medicines.  Unless your health care provider tells you otherwise, you may increase your fluid intake. This will help flush the contrast dye from your body. Drink enough fluid  to keep your urine clear or pale yellow.  It is up to you to get your test results. Ask your health care provider, or the department that is doing the test, when your results will be ready. Summary  A cardiac nuclear scan measures the blood flow to the heart when a person is resting and when he or she is exercising.  You may need this test if you are at risk for heart disease.  Tell your health care provider if you are pregnant.  Unless your health care provider tells you otherwise, increase your fluid intake. This will help flush the contrast dye from your body. Drink enough fluid to keep your urine clear or pale yellow. This information is not intended to replace advice given to you by your health care provider. Make sure you discuss any questions you have with your health care provider. Document Released: 05/02/2004 Document Revised: 04/09/2016 Document Reviewed: 03/16/2013 Elsevier Interactive Patient Education  2017 [employer].

## 2017-06-09 NOTE — Progress Notes (Signed)
Office Visit    Patient Name: Samuel Mason Date of Encounter: 06/09/2017  Primary Care Provider:  [name], MD Primary Cardiologist:  [name], MD  Chief Complaint    67 y/o ? with a history of nonobstructive and small vessel CAD, atypical chest pain, GERD, asymptomatic PVCs, hypertension, hyperlipidemia, stage III chronic kidney disease, obesity, and sleep apnea, who presents for follow-up due to recent episodes of nocturnal chest pain.  Past Medical History    Past Medical History:  Diagnosis Date  . Asymptomatic PVCs    a. 07/2012 Holter: 7965 PVCs in 48 hrs; b. 07/2015 24hr Holter: 6000 PVCs (7%).  [name] CAD (coronary artery disease)    a. 2009 Cath: nonobs dzs; b. 02/2010 Cath: LAD 63m, D1 90, LCX 38m, RCA min irregs, EF 60%-->Med Rx; c. 07/2012 Neg ETT; d. 05/2015 Neg MV. EF 73%.  . Carotid arterial disease ([place])    a. 11/2016 Carotid u/s: mild bilat carotid plaques.  . CKD (chronic kidney disease), stage III ([place])    a. Creat 1.67 05/22/2017.  [name] GERD (gastroesophageal reflux disease)    a. noncompliant w/ PPI.  [name] History of echocardiogram    a. 09/2016 Echo: EF 60-65%, no orwma, mild MR, mildly dil LA/RA.  [name] Hyperlipidemia   . Hypertension   . Morbid obesity ([place])   . Obstructive sleep apnea    a. noncompliant w/ CPAP.  [name] TIA (transient ischemic attack)    Past Surgical History:  Procedure Laterality Date  . CARDIAC CATHETERIZATION  2011   50% mid LAD stenosis, 40% proximal LCX, EF 60%  . CARDIAC CATHETERIZATION  2009   Mild CAD              Allergies  Allergies  Allergen Reactions  . Simvastatin     REACTION: Nausea    History of Present Illness    67 y/o ? with the above past medical history including atypical and musculoskeletal chest pain with prior cardiac evaluations including diagnostic catheterization in 2009 and 2011 as well as negative stress testing in 2014 and 2017.  Catheterization in 2011 showed moderate nonobstructive CAD with  significant small vessel/diagonal disease.  He has been medically managed.  Other history includes hypertension, hyperlipidemia, GERD, stage III chronic kidney disease, asymptomatic PVCs, obesity, and sleep apnea.  PVCs have been previously evaluated with Holter monitoring, most recently in 2017, which showed a 7% PVC burden over 24-hour period (6000 PVCs).  This is been chronically managed with beta-blocker therapy.  Echocardiogram was performed in June 2018 showing normal LV function.  Patient says that at baseline, he has had somewhat atypical and musculoskeletal/chest wall pain and muscle spasms over multiple years.  This will actually often occur when he is lifting weights in the gym several days per week.  As this symptom has been occurring for such a long time and has been worked up with both cath and ischemic testing, he does not think too much of it when it occurs.  Over the past 3 weeks however, he has been having more frequent episodes of retrosternal chest tightness occurring exclusively in the middle of the night.  Symptoms cause him to awaken suddenly, are not accompanied by any associated symptoms, can last up to 2 hours, and have been helped by taking Pepto-Bismol or by drinking ginger ale.  He is prescribed Nexium but stopped taking it about a year ago.  Since the symptoms have begun, he has taken 2 doses of Nexium with  some improvement but then he stopped taking it.  Despite nocturnal symptoms, he has not had any symptoms when he goes to the gym 3 times a week.  Typically when he is at the gym, he walks on a treadmill for 45 minutes and also does some light weight lifting.  He denies PND, orthopnea, dizziness, syncope, edema, or early satiety.  He is not noticed any metallic taste in the back of his mouth when he wakes up at night.  He has not had any blood in his stools but has had dark stools following doses of Pepto-Bismol.  Home Medications    Prior to Admission medications   Medication  Sig Start Date End Date Taking? Authorizing Provider  amLODipine (NORVASC) 10 MG tablet Take 15 mg by mouth daily.    Yes [provider]  aspirin 81 MG tablet Take 81 mg by mouth daily.   Yes [provider]  carvedilol (COREG) 6.25 MG tablet Take 6.25 mg by mouth 2 (two) times daily with a meal.   Yes [provider]  celecoxib (CELEBREX) 200 MG capsule Take 200 mg by mouth daily as needed for pain.   Yes [provider]  CRESTOR 10 MG tablet Take 10 mg by mouth daily. 06/27/15  Yes [provider]  eplerenone (INSPRA) 50 MG tablet Take 50 mg by mouth daily.   Yes [provider]  esomeprazole (NEXIUM) 20 MG capsule Take 20 mg by mouth daily at 12 noon.   Yes [provider]  losartan-hydrochlorothiazide (HYZAAR) 100-25 MG per tablet Take 1 tablet by mouth daily.   Yes [provider]  Omega-3 Fatty Acids (FISH OIL PO) Take 350 mg by mouth daily.   Yes [provider]    Review of Systems    As above, he has been having retrosternal chest discomfort that has been awakening him at night.  He has not had any symptoms with exercise.  He denies palpitations, dyspnea, PND, orthopnea, dizziness, syncope, edema, or early satiety..  All other systems reviewed and are otherwise negative except as noted above.  Physical Exam    VS:  BP (!) 138/92 (BP Location: Left Arm, Patient Position: Sitting, Cuff Size: Large)   Pulse 64   Ht 6\' 3"  (1.905 m)   Wt 278 lb 8 oz (126.3 kg)   BMI 34.81 kg/m  , BMI Body mass index is 34.81 kg/m. GEN: Obese, in no acute distress.  HEENT: normal.  Neck: Supple, obese, difficult to gauge JVP, no carotid bruits, or masses. Cardiac: Irregular with ectopy noted, no murmurs, rubs, or gallops. No clubbing, cyanosis, edema.  Radials/DP/PT 2+ and equal bilaterally.  Respiratory:  Respirations regular and unlabored, clear to auscultation bilaterally. GI: Soft, nontender, nondistended, BS + x  4. MS: no deformity or atrophy. Skin: warm and dry, no rash. Neuro:  Strength and sensation are intact. Psych: Normal affect.  Accessory Clinical Findings    ECG -regular sinus rhythm, PVC, left axis deviation, inferior infarct, no acute ST or T changes.  Assessment & Plan    1.  Midsternal chest pain/CAD: Patient has a history of atypical and musculoskeletal chest pain and chest wall muscle spasms that historically have been associated with exertion.  Catheterization most recently performed in 2011 revealed moderate nonobstructive disease with more significant disease in a small diagonal branch.  Over the past 3 weeks, he has been having almost nightly retrosternal chest discomfort and tightness that awakens him from sleep.  There are no  associated symptoms.  Symptoms seem to improve with antacid like Pepto-Bismol, Tums, or by drinking ginger ale.  He is prescribed Nexium but does not take this regularly.  He has not had any exertional symptoms despite continuing to exercise at least 3 days a week, often walking on a treadmill for 45 minutes and doing some weight lifting.  I have asked him to take his Nexium as prescribed-20 mg daily.  Given his cardiac history and risk factors, I will arrange for an exercise Myoview to rule out ischemia.  Provided that this looks okay, I would recommend primary care and potentially GI follow-up if PPI therapy does not improve symptoms.  2.  Essential hypertension: Blood pressure is mildly elevated today at 138/92, though it is notable that he has not yet taken his medicines.  He says at home, his pressures typically run less than 130 over less than 80.  He remains on amlodipine, carvedilol, and losartan-HCTZ.  His amlodipine formulation was recently changed to another manufacturer and he feels that it has caused some fatigue.  He has tried different pharmacies and different formulations but he cannot find the formulation that he previously tolerated well.  His blood  pressures remain well controlled on the current formulation.  He will remain on this and is hopeful that the fatigue will subside.  3.  Hyperlipidemia: Recent labs from primary care revealed normal LFTs with an LDL of 113.  He remains on statin therapy.  4.  Stage III chronic kidney disease: Creatinine recently 1.67.  He remains on ARB therapy.  5.  Asymptomatic PVCs: Stable on beta-blocker therapy.  Normal LV function by echo in 2018.  6.  GERD: See #1.  Advised to take PPI therapy as prescribed.  May need GI follow-up if symptoms do not improve and if ischemic evaluation normal.  7.  Morbid obesity: Patient continues to exercise regularly.  Unfortunately, he has not lost any weight.  8.  Obstructive sleep apnea: He is noncompliant with CPAP as he has not tolerated well.  He says he recently ordered a new mask hoping that that will be more comfortable.  9.  Disposition: Follow-up exercise Myoview later this week.  Follow-up with Dr. Fletcher Anon in 4-6 wks or sooner if necessary.   Murray Hodgkins, NP 06/09/2017, 12:28 PM

## 2017-06-11 ENCOUNTER — Ambulatory Visit: Payer: Medicare Other

## 2017-06-12 ENCOUNTER — Telehealth: Payer: Self-pay | Admitting: Cardiovascular Disease

## 2017-06-12 NOTE — Telephone Encounter (Signed)
Patient cancelled nm testing - pt calling stating he is going to do exam at PCP's office  Has upcoming 1 month fu please advise

## 2017-06-12 NOTE — Telephone Encounter (Signed)
To Ignacia Bayley, NP as an Juluis Rainier.

## 2017-06-16 ENCOUNTER — Telehealth: Payer: Self-pay | Admitting: Cardiovascular Disease

## 2017-06-16 NOTE — Telephone Encounter (Signed)
Stress test showed no evidence of ischemia but the patient had a tachycardic response to exercise with PVCs. He should keep his appointment with me next month to discuss.

## 2017-06-16 NOTE — Telephone Encounter (Signed)
Dr. Rosario Jacks calling with Results of stress test   Please have dr. Fletcher Anon return call.

## 2017-06-16 NOTE — Telephone Encounter (Addendum)
Treadmill myoview ordered by Ignacia Bayley, NP, for midsternal chest pain/CAD.  Scheduled Feb 21 but patient cancelled, stating he would have this at PCP office. Dr. Rosario Jacks called to review stress test with Dr. Fletcher Anon. Notified MD.

## 2017-06-23 ENCOUNTER — Telehealth: Payer: Self-pay | Admitting: Cardiovascular Disease

## 2017-06-23 DIAGNOSIS — I493 Ventricular premature depolarization: Secondary | ICD-10-CM

## 2017-06-23 NOTE — Telephone Encounter (Signed)
Per Dr. Fletcher Anon, he received a copy of the patient's stress test from Dr. Guerry Bruin office.  "Frequent PVC's at rest & exercise"  Orders received from Dr. Fletcher Anon to: 1) schedule the patient to wear a 24 hour holter 2) see Dr. Caryl Comes after the holter  I left a message at the patient's home # to please call the office to discuss.

## 2017-06-26 ENCOUNTER — Telehealth: Payer: Self-pay | Admitting: Cardiovascular Disease

## 2017-06-26 NOTE — Telephone Encounter (Signed)
We have received several faxes for the patient's C-PAP supplies. Reviewed with Ivin Booty, RN for Dr. Fletcher Anon to confirm he has not ordered this- she confirms Dr. Fletcher Anon has not ordered C-PAP supplies. She also states she has faxed the orders back to [place] stating we do not order the patient's C-PAP supplies.  I called [facility] today at [phone] and advised staff there that Dr. Fletcher Anon does not manage the patient's C-PAP equipment. Per [facility] staff, they will call the patient to follow up on who he sees for pulmonology.

## 2017-06-29 NOTE — Telephone Encounter (Signed)
Addendum: S/w patient on 06/25/17. He verbalized understanding of Dr Tyrell Antonio recommendations. He is scheduled for the Holter and appointment with Dr Caryl Comes on 07/16/17.

## 2017-07-07 ENCOUNTER — Ambulatory Visit (INDEPENDENT_AMBULATORY_CARE_PROVIDER_SITE_OTHER): Payer: Medicare Other

## 2017-07-07 DIAGNOSIS — I493 Ventricular premature depolarization: Secondary | ICD-10-CM

## 2017-07-09 ENCOUNTER — Ambulatory Visit: Payer: Medicare Other | Admitting: Internal Medicine

## 2017-07-14 ENCOUNTER — Ambulatory Visit: Payer: Medicare Other | Admitting: Cardiovascular Disease

## 2017-07-15 ENCOUNTER — Ambulatory Visit
Admission: RE | Admit: 2017-07-15 | Discharge: 2017-07-15 | Disposition: A | Discharge status: Home / Self Care | Payer: Medicare Other | Source: Ambulatory Visit | Attending: Cardiovascular Disease | Admitting: Cardiovascular Disease

## 2017-07-15 ENCOUNTER — Telehealth: Payer: Self-pay | Admitting: Cardiovascular Disease

## 2017-07-15 DIAGNOSIS — I493 Ventricular premature depolarization: Secondary | ICD-10-CM | POA: Diagnosis not present

## 2017-07-15 NOTE — Telephone Encounter (Signed)
Pt's wife returned call. We discussed reasons for 3/28 appt with Dr. Caryl Comes. Wife verbalized understanding and confirmed appt.

## 2017-07-15 NOTE — Telephone Encounter (Signed)
Called to confirm appointment with Dr. Caryl Comes tomorrow, spoke with wife Shirlean Mylar Patient had stress test done at hospital and was given a new f/u appt with Dr Jenna Luo Is wondering whether the appt with Dr Caryl Comes is still necessary Please call to discuss

## 2017-07-15 NOTE — Telephone Encounter (Signed)
Per March 5 phone note: Dr. Fletcher Anon, he received a copy of the patient's stress test from Dr. Guerry Bruin office.  "Frequent PVC's at rest & exercise"  Orders received from Dr. Fletcher Anon to: 1) schedule the patient to wear a 24 hour holter 2) see Dr. Caryl Comes after the holter   Left message on machine for patient's wife to contact the office.

## 2017-07-16 ENCOUNTER — Telehealth: Payer: Self-pay | Admitting: Internal Medicine

## 2017-07-16 ENCOUNTER — Ambulatory Visit (INDEPENDENT_AMBULATORY_CARE_PROVIDER_SITE_OTHER): Payer: Medicare Other | Admitting: Internal Medicine

## 2017-07-16 ENCOUNTER — Encounter: Payer: Self-pay | Admitting: Internal Medicine

## 2017-07-16 VITALS — BP 130/86 | HR 92 | Ht 75.0 in | Wt 277.5 lb

## 2017-07-16 DIAGNOSIS — I493 Ventricular premature depolarization: Secondary | ICD-10-CM

## 2017-07-16 DIAGNOSIS — I25118 Atherosclerotic heart disease of native coronary artery with other forms of angina pectoris: Secondary | ICD-10-CM | POA: Diagnosis not present

## 2017-07-16 MED ORDER — CARVEDILOL 12.5 MG PO TABS: 12.5000 mg | ORAL_TABLET | Freq: Two times a day (BID) | ORAL | 6 refills | Status: DC

## 2017-07-16 NOTE — Telephone Encounter (Signed)
Most recent BMP received- done 05/22/17 at Dr. Guerry Bruin office. BUN/ creatinine- 21/1.67. Will send to be scanned in the patient's chart.

## 2017-07-16 NOTE — Patient Instructions (Addendum)
Medication Instructions: - Your physician has recommended you make the following change in your medication:  1) INCREASE coreg (carvedilol) to 12.5 mg- take 1 tablet by mouth twice daily  - you may use up your current dose of carvedilol 6.25 mg- take 2 tablets (12.5 mg) by mouth TWICE daily until they are gone  Labwork: - none ordered  Procedures/Testing: - Your physician has recommended that you have a Cardiac CTA- dx: chest pain Our schedulers in our [address] office will contact you to arrange an appointment.   Cardiac CTA instructions:   Please arrive at the [facility] main entrance of [facility] at ___________ AM (30-45 minutes prior to test start time)  [facility] [place], [place] [address] [phone]  Proceed to the [facility] Radiology Department (First Floor).  Please follow these instructions carefully (unless otherwise directed):  Hold all erectile dysfunction medications at least 48 hours prior to test.  On the Night Before the Test: . Drink plenty of water. . Do not consume any caffeinated/decaffeinated beverages or chocolate 12 hours prior to your test. . Do not take any antihistamines 12 hours prior to your test.  On the Day of the Test: . Drink plenty of water. Do not drink any water within one hour of the test. . Do not eat any food 4 hours prior to the test. . You may take your regular medications prior to the test. . Please make sure to take your carvedilol the morning of your test  After the Test: . Drink plenty of water. . After receiving IV contrast, you may experience a mild flushed feeling. This is normal. . On occasion, you may experience a mild rash up to 24 hours after the test. This is not dangerous. If this occurs, you can take Benadryl 25 mg and increase your fluid intake. . If you experience trouble breathing, this can be serious. If it is severe call 911 IMMEDIATELY. If it is mild, please call our  office.   Follow-Up: - Your physician recommends that you schedule a follow-up appointment in: 6-8 weeks with Dr. Caryl Comes.  Any Additional Special Instructions Will Be Listed Below (If Applicable).     If you need a refill on your cardiac medications before your next appointment, please call your pharmacy.

## 2017-07-16 NOTE — Telephone Encounter (Signed)
I attempted to call Dr. Guerry Bruin office (PCP). No answer- I left a message for them to please fax a copy of the patient's most recent lab work for BMP.

## 2017-07-16 NOTE — Progress Notes (Signed)
ELECTROPHYSIOLOGY CONSULT NOTE  Patient ID: Samuel Mason, MRN: [id number], DOB/AGE: [birth year] 67 y.o. Admit date: (Not on file) Date of Consult: 07/16/2017  Primary Physician: [name], MD Primary Cardiologist: Samuel Mason is a 67 y.o. male who is being seen today for the evaluation of ectopy atrial and ventricular at the request of Samuel.     HPI Samuel Mason is a 67 y.o. male  Is referred because of frequent ectopy.  He has palpitations only intermittently.  He was recently referred to his PCP for a stress test.  He accomplished only about 1-1/2 minutes because of a significant increase in his ectopic burden.  The specifics are outlined below  He notes exercise intolerance primarily with climbing stairs.  He gets lightheaded and presyncopal.  Apart from this, he goes to the gym and does water aerobics number of times a week without difficulty.  He has not had syncope.  He has untreated sleep apnea.  He has poorly controlled hypertension. [name] DATE TEST EF   11/11 LHC  nonobs CAD  4/14 Myoview  73 % No ischemia  6/18 Echo  60-65%   3/19 Myoview  No ischemia   Date PVCs  4/14 7%  4/17 6%  3/19 9%--Multiple morphologies AIVR    He was seen by CB-NP with exertional chest discomfort.  A Myoview was ordered As above   Past medical history is also notable for morbid obesity, hypertension and a prior TIA.  Past Medical History:  Diagnosis Date  . Asymptomatic PVCs    a. 07/2012 Holter: 7965 PVCs in 48 hrs; b. 07/2015 24hr Holter: 6000 PVCs (7%).  [name] CAD (coronary artery disease)    a. 2009 Cath: nonobs dzs; b. 02/2010 Cath: LAD 48m, D1 90, LCX 61m, RCA min irregs, EF 60%-->Med Rx; c. 07/2012 Neg ETT; d. 05/2015 Neg MV. EF 73%.  . Carotid arterial disease ([place])    a. 11/2016 Carotid u/s: mild bilat carotid plaques.  . CKD (chronic kidney disease), stage III ([place])    a. Creat 1.67 05/22/2017.  [name] GERD (gastroesophageal reflux disease)    a. noncompliant w/ PPI.    [name] History of echocardiogram    a. 09/2016 Echo: EF 60-65%, no orwma, mild MR, mildly dil LA/RA.  [name] Hyperlipidemia   . Hypertension   . Morbid obesity ([place])   . Obstructive sleep apnea    a. noncompliant w/ CPAP.  [name] TIA (transient ischemic attack)       Surgical History:  Past Surgical History:  Procedure Laterality Date  . CARDIAC CATHETERIZATION  2011   50% mid LAD stenosis, 40% proximal LCX, EF 60%  . CARDIAC CATHETERIZATION  2009   Mild CAD               Home Meds: Prior to Admission medications   Medication Sig Start Date End Date Taking? Authorizing Provider  amLODipine (NORVASC) 10 MG tablet Take 15 mg by mouth daily.    Yes [provider]  aspirin 81 MG tablet Take 81 mg by mouth daily.   Yes [provider]  carvedilol (COREG) 6.25 MG tablet Take 6.25 mg by mouth 2 (two) times daily with a meal.   Yes [provider]  celecoxib (CELEBREX) 200 MG capsule Take 200 mg by mouth daily as needed for pain.   Yes [provider]  CRESTOR 10 MG tablet Take 10 mg by mouth daily. 06/27/15  Yes [provider]  eplerenone (INSPRA) 50 MG tablet Take 50 mg by mouth daily.   Yes [provider]  esomeprazole (NEXIUM) 20 MG capsule Take 20 mg by mouth daily as needed.    Yes [provider]  losartan-hydrochlorothiazide (HYZAAR) 100-25 MG per tablet Take 1 tablet by mouth daily.   Yes [provider]  Omega-3 Fatty Acids (FISH OIL PO) Take 350 mg by mouth daily.   Yes [provider]    Allergies:  Allergies  Allergen Reactions  . Simvastatin     REACTION: Nausea    Social History   Socioeconomic History  . Marital status: Married    Spouse name: Not on file  . Number of children: Not on file  . Years of education: Not on file  . Highest education level: Not on file  Occupational History  . Not on file  Social Needs  . Financial resource strain: Not on file  . Food insecurity:    Worry: Not  on file    Inability: Not on file  . Transportation needs:    Medical: Not on file    Non-medical: Not on file  Tobacco Use  . Smoking status: Never Smoker  . Smokeless tobacco: Never Used  Substance and Sexual Activity  . Alcohol use: No    Alcohol/week: 0.0 oz    Comment: socail drinker  . Drug use: No  . Sexual activity: Not on file  Lifestyle  . Physical activity:    Days per week: Not on file    Minutes per session: Not on file  . Stress: Not on file  Relationships  . Social connections:    Talks on phone: Not on file    Gets together: Not on file    Attends religious service: Not on file    Active member of club or organization: Not on file    Attends meetings of clubs or organizations: Not on file    Relationship status: Not on file  . Intimate partner violence:    Fear of current or ex partner: Not on file    Emotionally abused: Not on file    Physically abused: Not on file    Forced sexual activity: Not on file  Other Topics Concern  . Not on file  Social History Narrative  . Not on file     Family History  Problem Relation Age of Onset  . Heart attack Father        MI     ROS:  Please see the history of present illness.     All other systems reviewed and negative.    Physical Exam: Blood pressure 130/86, pulse 92, height 6\' 3"  (1.905 m), weight 277 lb 8 oz (125.9 kg). General: Well developed, well nourished male in no acute distress. Head: Normocephalic, atraumatic, sclera non-icteric, no xanthomas, nares are without discharge. EENT: normal  Lymph Nodes:  none Neck: Negative for carotid bruits. JVD not elevated. Back:without scoliosis kyphosis  Lungs: Clear bilaterally to auscultation without wheezes, rales, or rhonchi. Breathing is unlabored. Heart: Irregularly irregular rate and rhythm with S1 S2. 2/6 systolic murmur . No rubs, or gallops appreciated. Abdomen: Soft, non-tender, non-distended with normoactive bowel sounds. No hepatomegaly. No  rebound/guarding. No obvious abdominal masses. Msk:  Strength and tone appear normal for age. Extremities: No clubbing or cyanosis. 1+ edema.  Distal pedal pulses are 2+ and equal bilaterally. Skin: Warm and Dry Neuro: Alert and oriented X 3. CN III-XII intact Grossly normal  sensory and motor function . Psych:  Responds to questions appropriately with a normal affect.      Labs: Cardiac Enzymes No results for input(s): CKTOTAL, CKMB, TROPONINI in the last 72 hours. CBC No results found for: WBC, HGB, HCT, MCV, PLT PROTIME: No results for input(s): LABPROT, INR in the last 72 hours. Chemistry No results for input(s): NA, K, CL, CO2, BUN, CREATININE, CALCIUM, PROT, BILITOT, ALKPHOS, ALT, AST, GLUCOSE in the last 168 hours.  Invalid input(s): LABALBU Lipids Lab Results  Component Value Date   CHOL 234 (HH) 06/22/2006   HDL 36.4 (L) 06/22/2006   TRIG 91 06/22/2006   BNP No results found for: PROBNP Thyroid Function Tests: No results for input(s): TSH, T4TOTAL, T3FREE, THYROIDAB in the last 72 hours.  Invalid input(s): FREET3 Miscellaneous No results found for: DDIMER  Radiology/Studies:  No results found.  EKG: Sinus rhythm at 60 Intervals 18/12/40 PACs-frequent variable conduction and some aberration  PVCs  Treadmill personally reviewed sinus rhythm with exercise associated PVCs, AIVR, and atrial tachycardia  Holter personally reviewed sinus rhythm with PVCs, PACs, atrial tachycardia, AIVR  Assessment and Plan:   AIVR  Atrial tachycardia  PVCs  Sinus bradycardia (relative)  Coronary disease nonobstructive  Chest pain-atypical  Sleep apnea-untreated  Hypertension-poorly controlled  The patient has frequent atrial and ventricular ectopy. I suspect that the PVC count is an overestimate because of over counting of both atrial ectopy with some degree of aberration as well as AIVR  He has some symptoms with this particular lightheadedness with exertion.   Hence, I think therapy is indicated.  We have reviewed beta-blockers and calcium blockers.  Up titration of these may be limited by his bradycardia.  We will start with carvedilol.  We have reviewed side effects.  He will let us know if they become worse.  His blood pressure is poorly controlled.  I have encouraged him to follow through on sleep apnea therapy.  His chest pain has largely atypical features.  However, knowing that he had coronary disease in 2011 makes the likelihood of a false negative Myoview concerning not withstanding the atypical nature of his chest discomfort.  We will undertake CTA with FFR    His edema may also be secondary to his amlodipine.   Virl Axe

## 2017-07-22 ENCOUNTER — Telehealth: Payer: Self-pay | Admitting: Internal Medicine

## 2017-07-22 NOTE — Telephone Encounter (Signed)
Patient calling to check status of CT Scan scheduling

## 2017-07-22 NOTE — Telephone Encounter (Signed)
Order placed on 07/16/17 for a cardiac CT to be done. Staff message sent that same day to the [facility] pool to schedule. I have messaged them again today to check on the status of the patient being contacted to schedule his Cardiac CTA .

## 2017-07-27 NOTE — Telephone Encounter (Signed)
The patient is scheduled for his cardiac CT on Monday 08/03/17.

## 2017-07-28 ENCOUNTER — Ambulatory Visit (INDEPENDENT_AMBULATORY_CARE_PROVIDER_SITE_OTHER): Payer: Medicare Other | Admitting: Cardiovascular Disease

## 2017-07-28 ENCOUNTER — Encounter: Payer: Self-pay | Admitting: Cardiovascular Disease

## 2017-07-28 ENCOUNTER — Other Ambulatory Visit
Admission: RE | Admit: 2017-07-28 | Discharge: 2017-07-28 | Disposition: A | Discharge status: Home / Self Care | Payer: Medicare Other | Source: Ambulatory Visit | Attending: Cardiovascular Disease | Admitting: Cardiovascular Disease

## 2017-07-28 VITALS — BP 120/70 | HR 75 | Ht 75.0 in | Wt 275.0 lb

## 2017-07-28 DIAGNOSIS — I493 Ventricular premature depolarization: Secondary | ICD-10-CM | POA: Diagnosis not present

## 2017-07-28 DIAGNOSIS — N183 Chronic kidney disease, stage 3 unspecified: Secondary | ICD-10-CM

## 2017-07-28 DIAGNOSIS — E785 Hyperlipidemia, unspecified: Secondary | ICD-10-CM

## 2017-07-28 DIAGNOSIS — G4730 Sleep apnea, unspecified: Secondary | ICD-10-CM | POA: Diagnosis not present

## 2017-07-28 DIAGNOSIS — R0789 Other chest pain: Secondary | ICD-10-CM | POA: Diagnosis not present

## 2017-07-28 DIAGNOSIS — I10 Essential (primary) hypertension: Secondary | ICD-10-CM | POA: Diagnosis not present

## 2017-07-28 DIAGNOSIS — I25119 Atherosclerotic heart disease of native coronary artery with unspecified angina pectoris: Secondary | ICD-10-CM

## 2017-07-28 LAB — BASIC METABOLIC PANEL
Anion gap: 8 (ref 5–15)
BUN: 25 mg/dL — AB (ref 6–20)
CALCIUM: 9 mg/dL (ref 8.9–10.3)
CHLORIDE: 105 mmol/L (ref 101–111)
CO2: 24 mmol/L (ref 22–32)
Creatinine, Ser: 1.86 mg/dL — ABNORMAL HIGH (ref 0.61–1.24)
GFR calc non Af Amer: 36 mL/min — ABNORMAL LOW (ref >60)
GFR, EST AFRICAN AMERICAN: 42 mL/min — AB (ref >60)
GLUCOSE: 93 mg/dL (ref 65–99)
Potassium: 3.6 mmol/L (ref 3.5–5.1)
Sodium: 137 mmol/L (ref 135–145)

## 2017-07-28 LAB — MAGNESIUM: Magnesium: 2 mg/dL (ref 1.7–2.4)

## 2017-07-28 NOTE — Progress Notes (Signed)
Cardiology Office Note   Date:  07/28/2017   ID:  Samuel Mason, DOB 1951/02/10, MRN [id number]  PCP:  [name], MD  Cardiologist:   [name], MD   Chief Complaint  Patient presents with  . Other    12 month follow up. patient states he has chest pain and SOB no more than he usually does. Meds reviewed verbally with patient.       History of Present Illness: Samuel Mason is a 67 y.o. male who presents for a followup visit regarding hypertension and frequent asymptomatic PVCs.  He is known to have refractory hypertension with no evidence of renal artery stenosis. He also suffers from chronic chest pain likely musculoskeletal with cardiac catheterization done twice in 2009 and 2011. Both times there was mild to moderate CAD without evidence of obstructive disease. He also has known history of sleep apnea does not tolerate CPAP well. He was first noted to have frequent PVCs years ago during his sleep study and has been treated medically with a beta blocker. He did not tolerate the higher dose of carvedilol due to fatigue and bradycardia. Echocardiogram 07/2012  showed normal LV systolic function with only mild left ventricular hypertrophy. 4  He was seen by Gerald Stabs in February for atypical chest pain.  The chest pain was felt to be GI in nature but he did undergo a treadmill nuclear stress test which showed no clear evidence of ischemia although he did have frequent PVCs during exercise and in recovery. He underwent a Holter monitor which showed significant PVCs with a total of 13,000 beats in 24 hours representing 13% burden. I referred the patient to Dr. Caryl Comes.  Given his continued chest pain, the patient was referred for CT FFR.  The dose of carvedilol was increased to 12.5 mg twice daily.  However, he did not tolerate this dose due to extreme fatigue and currently is taking 6.25 mg 3 times daily.  Past Medical History:  Diagnosis Date  . Asymptomatic PVCs    a. 07/2012  Holter: 7965 PVCs in 48 hrs; b. 07/2015 24hr Holter: 6000 PVCs (7%).  [name] CAD (coronary artery disease)    a. 2009 Cath: nonobs dzs; b. 02/2010 Cath: LAD 62m, D1 90, LCX 42m, RCA min irregs, EF 60%-->Med Rx; c. 07/2012 Neg ETT; d. 05/2015 Neg MV. EF 73%.  . Carotid arterial disease ([place])    a. 11/2016 Carotid u/s: mild bilat carotid plaques.  . CKD (chronic kidney disease), stage III ([place])    a. Creat 1.67 05/22/2017.  [name] GERD (gastroesophageal reflux disease)    a. noncompliant w/ PPI.  [name] History of echocardiogram    a. 09/2016 Echo: EF 60-65%, no orwma, mild MR, mildly dil LA/RA.  [name] Hyperlipidemia   . Hypertension   . Morbid obesity ([place])   . Obstructive sleep apnea    a. noncompliant w/ CPAP.  [name] TIA (transient ischemic attack)     Past Surgical History:  Procedure Laterality Date  . CARDIAC CATHETERIZATION  2011   50% mid LAD stenosis, 40% proximal LCX, EF 60%  . CARDIAC CATHETERIZATION  2009   Mild CAD               Current Outpatient Medications  Medication Sig Dispense Refill  . amLODipine (NORVASC) 10 MG tablet Take 15 mg by mouth daily.     [name] aspirin 81 MG tablet Take 81 mg by mouth daily.    . carvedilol (COREG) 6.25 MG tablet  Take 6.25 mg by mouth 3 (three) times daily.    . celecoxib (CELEBREX) 200 MG capsule Take 200 mg by mouth daily as needed for pain.    [name] CRESTOR 10 MG tablet Take 10 mg by mouth daily.  3  . eplerenone (INSPRA) 50 MG tablet Take 50 mg by mouth daily.    [name] esomeprazole (NEXIUM) 20 MG capsule Take 20 mg by mouth daily as needed.     [name] losartan-hydrochlorothiazide (HYZAAR) 100-25 MG per tablet Take 1 tablet by mouth daily.    . Omega-3 Fatty Acids (FISH OIL PO) Take 350 mg by mouth daily.    . ranitidine (ZANTAC) 150 MG capsule Take 150 mg by mouth daily.     No current facility-administered medications for this visit.     Allergies:   Simvastatin    Social History:  The patient  reports that he has never smoked. He has never used smokeless tobacco.  He reports that he does not drink alcohol or use drugs.   Family History:  The patient's family history includes Heart attack in his father.    ROS:  Please see the history of present illness.   Otherwise, review of systems are positive for none.   All other systems are reviewed and negative.    PHYSICAL EXAM: VS:  BP 120/70 (BP Location: Left Arm, Patient Position: Sitting, Cuff Size: Large)   Pulse 75   Ht 6\' 3"  (1.905 m)   Wt 275 lb (124.7 kg)   BMI 34.37 kg/m  , BMI Body mass index is 34.37 kg/m. GEN: Well nourished, well developed, in no acute distress  HEENT: normal  Neck: no JVD, carotid bruits, or masses Cardiac: RRR with premature beats; no murmurs, rubs, or gallops,no edema  Respiratory:  clear to auscultation bilaterally, normal work of breathing GI: soft, nontender, nondistended, + BS MS: no deformity or atrophy  Skin: warm and dry, no rash Neuro:  Strength and sensation are intact Psych: euthymic mood, full affect   EKG:  EKG is ordered today. The ekg ordered today demonstrates sinus bradycardia with frequent PVCs.   Recent Labs: 07/28/2017: BUN 25; Creatinine, Ser 1.86; Magnesium 2.0; Potassium 3.6; Sodium 137    Lipid Panel    Component Value Date/Time   CHOL 234 (HH) 06/22/2006 1553   TRIG 91 06/22/2006 1553   HDL 36.4 (L) 06/22/2006 1553   CHOLHDL 6.4 CALC 06/22/2006 1553   VLDL 18 06/22/2006 1553   LDLDIRECT 183.4 06/22/2006 1553      Wt Readings from Last 3 Encounters:  07/28/17 275 lb (124.7 kg)  07/16/17 277 lb 8 oz (125.9 kg)  06/09/17 278 lb 8 oz (126.3 kg)         ASSESSMENT AND PLAN:  1. Asymptomatic PVCs:   He continues to have frequent PVCs with no evidence of structural heart disease or cardiomyopathy.  He did not tolerate higher doses of carvedilol likely due to bradycardia.  Given the burden of PVCs, he might require an antiarrhythmic medication.  He has a follow-up appointment with Dr. Caryl Comes next month.  2.  Atypical chest  pain: The patient has known history of atypical musculoskeletal chest pain.  He is scheduled for CTA of the coronary arteries but concerned about underlying chronic kidney disease.  I am going to check basic metabolic profile.  If creatinine is significantly abnormal, I do not think we need to proceed with CTA.  Recent nuclear stress test showed no evidence of ischemia.  3. Refractory  hypertension: Blood pressure is well controlled on current medications.   4. Hyperlipidemia: Continue treatment with rosuvastatin with a target LDL of less than 70. This is followed by Dr. Rosario Jacks.   5.  Sleep apnea: The patient is requesting adjustment of his sleep apnea setting.  I am going to refer him to pulmonary for management of this.  Disposition:   FU with me in 1 year  Signed,  [name], MD  07/28/2017 5:14 PM    [facility]

## 2017-07-28 NOTE — Patient Instructions (Signed)
Medication Instructions:  Your physician recommends that you continue on your current medications as directed. Please refer to the Current Medication list given to you today.   Labwork: BMET and magnesium at the [facility] lab. No appointment needed.  Testing/Procedures: none  Follow-Up: Your physician wants you to follow-up in: 1 year with Dr. Fletcher Anon.  You will receive a reminder letter in the mail two months in advance. If you don't receive a letter, please call our office to schedule the follow-up appointment.   Any Other Special Instructions Will Be Listed Below (If Applicable). You have been referred to Pulmonary for management of sleep apnea.      If you need a refill on your cardiac medications before your next appointment, please call your pharmacy.

## 2017-08-03 ENCOUNTER — Ambulatory Visit (HOSPITAL_COMMUNITY): Payer: Medicare Other

## 2017-08-04 ENCOUNTER — Ambulatory Visit (INDEPENDENT_AMBULATORY_CARE_PROVIDER_SITE_OTHER): Payer: Medicare Other | Admitting: Internal Medicine

## 2017-08-04 ENCOUNTER — Encounter: Payer: Self-pay | Admitting: Internal Medicine

## 2017-08-04 VITALS — BP 148/90 | HR 67 | Resp 16 | Ht 71.0 in | Wt 268.0 lb

## 2017-08-04 DIAGNOSIS — I25119 Atherosclerotic heart disease of native coronary artery with unspecified angina pectoris: Secondary | ICD-10-CM | POA: Diagnosis not present

## 2017-08-04 DIAGNOSIS — G4733 Obstructive sleep apnea (adult) (pediatric): Secondary | ICD-10-CM | POA: Diagnosis not present

## 2017-08-04 NOTE — Patient Instructions (Signed)
Will decrease pressure to 8, follow up in 3 months with download at that time.

## 2017-08-04 NOTE — Addendum Note (Signed)
Addended by: Stephanie Coup on: 08/04/2017 11:02 AM   Modules accepted: Orders

## 2017-08-04 NOTE — Progress Notes (Signed)
[facility] Consultation      Assessment and Plan:  Obstructive sleep apnea. -History of obstructive sleep apnea, was previously on CPAP at a pressure of 10, currently feels that the pressure is high and causing chest discomfort.  Patient is undergoing evaluation for ongoing chest issues, and chest discomfort may be confused with cardiac discomfort. -We will decrease pressure from 10 to 8 cm H2O.  - Asked to follow-up in 3 months time and will check download at that time.  He is asked to call us back sooner should he develop any issues with tolerance of CPAP.  Coronary artery disease, TIA, GERD, essential hypertension. - Sleep apnea can contribute to above issues, therefore adequate control of sleep apnea is important for their management.   Date: 08/04/2017  MRN# [id number] Samuel Mason 1950-11-02  Referring Physician: Dr Fletcher Anon.   Samuel Mason is a 68 y.o. old male seen in consultation for chief complaint of:    Chief Complaint  Patient presents with  . Consult    pt already on cpap was referred by Dr. Fletcher Anon for pressure change/managment.  . Sleep Apnea    pt has been on cpap therapy for 9 years off and on. He recently restarted therapy and feels that pressure is to high on machine. He today to have settings changed.    HPI:   He has been diagnosed with OSA several years ago, about 9 years ago. He used it for 2 years regularly, then started to trail off and using it on and off for the next several, and then stopped completely about 2 years ago.  Recently he has started having chest pain and saw Dr. Fletcher Anon, and underwent testing. He was also asked to restart using the CPAP. He has tried it for about 10 days, he was having some soreness in the chest. He seems to tolerate the pressure ok, he is really concerned about the pressure and the chest soreness, which he would grade as 5/10.  He is concerned as he is having current cardiac issues, and obviously having chest  issues from CPAP may interfere.  **Review of outside sleep study report from 08/30/2007.  This was a CPAP titration, patient required CPAP at a level of 10 with an AHI reduced to 0.  In the study the patient had a central apnea index of as high as 6.  PMHX:   Past Medical History:  Diagnosis Date  . Asymptomatic PVCs    a. 07/2012 Holter: 7965 PVCs in 48 hrs; b. 07/2015 24hr Holter: 6000 PVCs (7%).  [name] CAD (coronary artery disease)    a. 2009 Cath: nonobs dzs; b. 02/2010 Cath: LAD 41m, D1 90, LCX 80m, RCA min irregs, EF 60%-->Med Rx; c. 07/2012 Neg ETT; d. 05/2015 Neg MV. EF 73%.  . Carotid arterial disease ([place])    a. 11/2016 Carotid u/s: mild bilat carotid plaques.  . CKD (chronic kidney disease), stage III ([place])    a. Creat 1.67 05/22/2017.  [name] GERD (gastroesophageal reflux disease)    a. noncompliant w/ PPI.  [name] History of echocardiogram    a. 09/2016 Echo: EF 60-65%, no orwma, mild MR, mildly dil LA/RA.  [name] Hyperlipidemia   . Hypertension   . Morbid obesity ([place])   . Obstructive sleep apnea    a. noncompliant w/ CPAP.  [name] TIA (transient ischemic attack)    Surgical Hx:  Past Surgical History:  Procedure Laterality Date  . CARDIAC CATHETERIZATION  2011   50%  mid LAD stenosis, 40% proximal LCX, EF 60%  . CARDIAC CATHETERIZATION  2009   Mild CAD             Family Hx:  Family History  Problem Relation Age of Onset  . Heart attack Father        MI   Social Hx:   Social History   Tobacco Use  . Smoking status: Never Smoker  . Smokeless tobacco: Never Used  Substance Use Topics  . Alcohol use: No    Alcohol/week: 0.0 oz    Comment: socail drinker  . Drug use: No   Medication:    Current Outpatient Medications:  .  amLODipine (NORVASC) 10 MG tablet, Take 15 mg by mouth daily. , Disp: , Rfl:  .  aspirin 81 MG tablet, Take 81 mg by mouth daily., Disp: , Rfl:  .  carvedilol (COREG) 6.25 MG tablet, Take 6.25 mg by mouth 3 (three) times daily., Disp: , Rfl:  .  celecoxib  (CELEBREX) 200 MG capsule, Take 200 mg by mouth daily as needed for pain., Disp: , Rfl:  .  CRESTOR 10 MG tablet, Take 10 mg by mouth daily., Disp: , Rfl: 3 .  eplerenone (INSPRA) 50 MG tablet, Take 50 mg by mouth daily., Disp: , Rfl:  .  esomeprazole (NEXIUM) 20 MG capsule, Take 20 mg by mouth daily as needed. , Disp: , Rfl:  .  losartan-hydrochlorothiazide (HYZAAR) 100-25 MG per tablet, Take 1 tablet by mouth daily., Disp: , Rfl:  .  Omega-3 Fatty Acids (FISH OIL PO), Take 350 mg by mouth daily., Disp: , Rfl:  .  ranitidine (ZANTAC) 150 MG capsule, Take 150 mg by mouth daily., Disp: , Rfl:    Allergies:  Simvastatin  Review of Systems: Gen:  Denies  fever, sweats, chills HEENT: Denies blurred vision, double vision. bleeds, sore throat Cvc:  No dizziness, chest pain. Resp:   Denies cough or sputum production, shortness of breath Gi: Denies swallowing difficulty, stomach pain. Gu:  Denies bladder incontinence, burning urine Ext:   No Joint pain, stiffness. Skin: No skin rash,  hives  Endoc:  No polyuria, polydipsia. Psych: No depression, insomnia. Other:  All other systems were reviewed with the patient and were negative other that what is mentioned in the HPI.   Physical Examination:   VS: BP (!) 148/90 (BP Location: Left Arm, Cuff Size: Normal)   Pulse 67   Resp 16   Ht 5\' 11"  (1.803 m)   Wt 268 lb (121.6 kg)   SpO2 98%   BMI 37.38 kg/m   General Appearance: No distress  Neuro:without focal findings,  speech normal,  HEENT: PERRLA, EOM intact.   Pulmonary: normal breath sounds, No wheezing.  CardiovascularNormal S1,S2.  No m/r/g.   Abdomen: Benign, Soft, non-tender. Renal:  No costovertebral tenderness  GU:  No performed at this time. Endoc: No evident thyromegaly, no signs of acromegaly. Skin:   warm, no rashes, no ecchymosis  Extremities: normal, no cyanosis, clubbing.  Other findings:    LABORATORY PANEL:   CBC No results for input(s): WBC, HGB, HCT, PLT in  the last 168 hours. ------------------------------------------------------------------------------------------------------------------  Chemistries  Recent Labs  Lab 07/28/17 1653  NA 137  K 3.6  CL 105  CO2 24  GLUCOSE 93  BUN 25*  CREATININE 1.86*  CALCIUM 9.0  MG 2.0   ------------------------------------------------------------------------------------------------------------------  Cardiac Enzymes No results for input(s): TROPONINI in the last 168 hours. ------------------------------------------------------------  RADIOLOGY:  No results  found.     Thank  you for the consultation and for allowing [facility] to assist in the care of your patient. Our recommendations are noted above.  Please contact us if we can be of further service.   Marda Stalker, MD.  Board Certified in Internal Medicine, Pulmonary Medicine, [place], and Sleep Medicine.  [facility] Office Number: [phone]  Patricia Pesa, M.D.  Merton Border, M.D  08/04/2017

## 2017-08-10 ENCOUNTER — Institutional Professional Consult (permissible substitution): Payer: Medicare Other | Admitting: Internal Medicine

## 2017-08-14 ENCOUNTER — Telehealth: Payer: Self-pay | Admitting: Internal Medicine

## 2017-08-14 NOTE — Telephone Encounter (Signed)
Can order new sleep study, which type is covered by insurance.

## 2017-08-14 NOTE — Addendum Note (Signed)
Addended by: Stephanie Coup on: 08/14/2017 12:13 PM   Modules accepted: Orders

## 2017-08-14 NOTE — Telephone Encounter (Signed)
HST ordered.  °Nothing further needed.  ° °

## 2017-08-14 NOTE — Telephone Encounter (Signed)
Called [facility] and they do not have the baseline study that was done back in 2008 or 2009.  We do not have the study in order to provide.   Since we are unable to locate study a new study will need to be ordered.  Please advise. Rhonda J Cobb

## 2017-08-14 NOTE — Telephone Encounter (Signed)
Patient came to office to speak with someone about [facility]'s needs for orders and information for a new machine and setting orders. Waiting in [place]

## 2017-08-20 ENCOUNTER — Encounter: Payer: Self-pay | Admitting: Internal Medicine

## 2017-08-20 DIAGNOSIS — G4733 Obstructive sleep apnea (adult) (pediatric): Secondary | ICD-10-CM

## 2017-08-21 DIAGNOSIS — G4733 Obstructive sleep apnea (adult) (pediatric): Secondary | ICD-10-CM | POA: Diagnosis not present

## 2017-08-27 ENCOUNTER — Telehealth: Payer: Self-pay | Admitting: *Deleted

## 2017-08-27 DIAGNOSIS — G4733 Obstructive sleep apnea (adult) (pediatric): Secondary | ICD-10-CM

## 2017-08-27 NOTE — Telephone Encounter (Signed)
Pt aware of results of HST. Orders placed Nothing further needed. 

## 2017-09-03 ENCOUNTER — Ambulatory Visit: Payer: Medicare Other | Admitting: Internal Medicine

## 2017-09-15 ENCOUNTER — Telehealth: Payer: Self-pay | Admitting: Internal Medicine

## 2017-09-15 DIAGNOSIS — G4733 Obstructive sleep apnea (adult) (pediatric): Secondary | ICD-10-CM

## 2017-09-15 NOTE — Telephone Encounter (Signed)
Pt came into office today and stated that he hasn't heard from [place] regarding his CPAP order.  Pt stated that he had attempted to contact them and that he was speaking with people in [place] and couldn't make them understand what he was speaking about.  Pt stated that he wanted to new order sent to [facility] in [place].   Please place new order for [facility] for CPAP.  Rhonda J Cobb

## 2017-09-15 NOTE — Telephone Encounter (Signed)
Orders placed.

## 2017-09-17 ENCOUNTER — Encounter: Payer: Self-pay | Admitting: Internal Medicine

## 2017-09-17 ENCOUNTER — Ambulatory Visit (INDEPENDENT_AMBULATORY_CARE_PROVIDER_SITE_OTHER): Payer: Medicare Other | Admitting: Internal Medicine

## 2017-09-17 VITALS — BP 132/70 | HR 60 | Ht 75.0 in | Wt 274.5 lb

## 2017-09-17 DIAGNOSIS — I493 Ventricular premature depolarization: Secondary | ICD-10-CM

## 2017-09-17 DIAGNOSIS — I25119 Atherosclerotic heart disease of native coronary artery with unspecified angina pectoris: Secondary | ICD-10-CM

## 2017-09-17 MED ORDER — FUROSEMIDE 20 MG PO TABS: ORAL_TABLET | ORAL | 6 refills | Status: DC

## 2017-09-17 NOTE — Progress Notes (Signed)
Patient Care Team: [name], MD as PCP - General (Internal Medicine) Wellington Hampshire, MD as PCP - Cardiology (Cardiology)   HPI  Samuel Mason is a 67 y.o. male Seen in follow-up for symptom medic PACs and PVCs of high density.  He is much improved on carvedilol at higher doses.  He denies shortness of breath but has had recent problems with peripheral edema.  His diet is quite replete his sodium particularly in his snacking.  He has been found to have sleep apnea and awaits his CPAP  Records and Results Reviewed Dr. Tyrell Antonio notes   DATE TEST EF   11/11 LHC  nonobs CAD  4/14 Myoview  73 % No ischemia  6/18 Echo  60-65%   3/19 Myoview  No ischemia   Date PVCs  4/14 7%  4/17 6%  3/19 9%--Multiple morphologies AIVR      Past Medical History:  Diagnosis Date  . Asymptomatic PVCs    a. 07/2012 Holter: 7965 PVCs in 48 hrs; b. 07/2015 24hr Holter: 6000 PVCs (7%).  [name] CAD (coronary artery disease)    a. 2009 Cath: nonobs dzs; b. 02/2010 Cath: LAD 85m, D1 90, LCX 19m, RCA min irregs, EF 60%-->Med Rx; c. 07/2012 Neg ETT; d. 05/2015 Neg MV. EF 73%.  . Carotid arterial disease ([place])    a. 11/2016 Carotid u/s: mild bilat carotid plaques.  . CKD (chronic kidney disease), stage III ([place])    a. Creat 1.67 05/22/2017.  [name] GERD (gastroesophageal reflux disease)    a. noncompliant w/ PPI.  [name] History of echocardiogram    a. 09/2016 Echo: EF 60-65%, no orwma, mild MR, mildly dil LA/RA.  [name] Hyperlipidemia   . Hypertension   . Morbid obesity ([place])   . Obstructive sleep apnea    a. noncompliant w/ CPAP.  [name] TIA (transient ischemic attack)     Past Surgical History:  Procedure Laterality Date  . CARDIAC CATHETERIZATION  2011   50% mid LAD stenosis, 40% proximal LCX, EF 60%  . CARDIAC CATHETERIZATION  2009   Mild CAD              Current Meds  Medication Sig  . amLODipine (NORVASC) 10 MG tablet Take 15 mg by mouth daily.   [name] aspirin 81 MG tablet Take 81 mg by mouth  daily.  . carvedilol (COREG) 6.25 MG tablet Take 6.25 mg by mouth 3 (three) times daily.  . celecoxib (CELEBREX) 200 MG capsule Take 200 mg by mouth daily as needed for pain.  [name] CRESTOR 10 MG tablet Take 10 mg by mouth daily.  [name] eplerenone (INSPRA) 50 MG tablet Take 50 mg by mouth daily.  [name] esomeprazole (NEXIUM) 20 MG capsule Take 20 mg by mouth daily as needed.   [name] losartan-hydrochlorothiazide (HYZAAR) 100-25 MG per tablet Take 1 tablet by mouth daily.  . Omega-3 Fatty Acids (FISH OIL PO) Take 350 mg by mouth daily.  . ranitidine (ZANTAC) 150 MG capsule Take 150 mg by mouth daily.    Allergies  Allergen Reactions  . Simvastatin     REACTION: Nausea      Review of Systems negative except from HPI and PMH  Physical Exam BP 132/70 (BP Location: Left Arm, Patient Position: Sitting, Cuff Size: Normal)   Pulse 60   Ht 6\' 3"  (1.905 m)   Wt 274 lb 8 oz (124.5 kg)   BMI 34.31 kg/m  Well developed and well nourished in no acute  distress HENT normal E scleral and icterus clear Neck Supple JVP flat; carotids brisk and full Clear to ausculation Regular rate and rhythm, no murmurs gallops or rub Soft with active bowel sounds No clubbing cyanosis 1+ Edema Alert and oriented, grossly normal motor and sensory function Skin Warm and Dry  Sinus rhythm at 60 Interval 21/10/43 Low voltage Isolated PVC  Assessment and Plan:   AIVR  Atrial tachycardia  PVCs  Low voltage  Sinus bradycardia (relative)  Coronary disease nonobstructive  Chest pain-atypical  Sleep apnea-untreated  Hypertension-poorly controlled   The patient's rhythm is much improved on higher doses of carvedilol.  We will not make any other adjustments at this time.  His blood pressure remains difficult to control; his CPAP is pending.  His edema may or may not be related to the amlodipine; we have reviewed the implications of salt.  He will work on decreasing this although he is asked that we give him a  low-dose diuretic.  I have given him a prescription for furosemide 20 mg to take every other day needed.      Current medicines are reviewed at length with the patient today .  The patient does not  have concerns regarding medicines.

## 2017-09-17 NOTE — Patient Instructions (Signed)
Medication Instructions: - Your physician has recommended you make the following change in your medication:  1) START lasix (furosemide) 20 mg- take 1 tablet (20 mg) by mouth once daily as needed for increased shortness of breath/ swelling/ weight gain  Labwork: - none ordered  Procedures/Testing: - none ordered  Follow-Up: - Dr. Caryl Comes will see you back on an as needed basis.  - Your physician wants you to follow-up in: 07/2018 with Dr. Fletcher Anon. You will receive a reminder letter in the mail two months in advance. If you don't receive a letter, please call our office to schedule the follow-up appointment.   Any Additional Special Instructions Will Be Listed Below (If Applicable).     If you need a refill on your cardiac medications before your next appointment, please call your pharmacy.

## 2017-09-28 ENCOUNTER — Ambulatory Visit (INDEPENDENT_AMBULATORY_CARE_PROVIDER_SITE_OTHER): Payer: Medicare Other | Admitting: Podiatry

## 2017-09-28 ENCOUNTER — Encounter: Payer: Self-pay | Admitting: Podiatry

## 2017-09-28 DIAGNOSIS — B351 Tinea unguium: Secondary | ICD-10-CM

## 2017-09-28 DIAGNOSIS — M79674 Pain in right toe(s): Secondary | ICD-10-CM

## 2017-09-28 DIAGNOSIS — M79675 Pain in left toe(s): Secondary | ICD-10-CM

## 2017-09-28 NOTE — Progress Notes (Signed)
Complaint:  Visit Type: Patient returns to my office for continued preventative foot care services. Complaint: Patient states" my nails have grown long and thick and become painful to walk and wear shoes"  The patient presents for preventative foot care services. No changes to ROS  Podiatric Exam: Vascular: dorsalis pedis and posterior tibial pulses are palpable bilateral. Capillary return is immediate. Temperature gradient is WNL. Skin turgor WNL  Sensorium: Normal Semmes Weinstein monofilament test. Normal tactile sensation bilaterally. Nail Exam: Pt has thick disfigured discolored nails with subungual debris noted bilateral entire nail hallux through fifth toenails.  Right hallux nail has hematoma and is loosely attached to the nail bed.  No drainage noted. Ulcer Exam: There is no evidence of ulcer or pre-ulcerative changes or infection. Orthopedic Exam: Muscle tone and strength are WNL. No limitations in general ROM. No crepitus or effusions noted. Foot type and digits show no abnormalities. Bony prominences are unremarkable. Skin: No Porokeratosis. No infection or ulcers  Diagnosis:  Onychomycosis, , Pain in right toe, pain in left toes  Treatment & Plan Procedures and Treatment: Consent by patient was obtained for treatment procedures.   Debridement of mycotic and hypertrophic toenails, 1 through 5 bilateral and clearing of subungual debris. No ulceration, no infection noted.  Return Visit-Office Procedure: Patient instructed to return to the office for a follow up visit 3 months for continued evaluation and treatment.    Gardiner Barefoot DPM

## 2017-10-10 ENCOUNTER — Encounter: Payer: Self-pay | Admitting: Internal Medicine

## 2017-10-26 NOTE — Telephone Encounter (Signed)
[facility] calling to check status of faxed medical necessity form for orders below .  Please call to update.

## 2017-10-26 NOTE — Telephone Encounter (Signed)
Anderson Malta will send the fax again incase it was not received.

## 2017-11-01 ENCOUNTER — Encounter: Payer: Self-pay | Admitting: Internal Medicine

## 2017-11-02 NOTE — Progress Notes (Signed)
[name]      Assessment and Plan:  Obstructive sleep apnea. -History of obstructive sleep apnea, was previously on CPAP at a pressure of 10, but was causing some chest discomfort.  Today he notes that he feels that the pressure is too low after decreasing the lower pressure to 8, therefore will increase back to a pressure of 10-16. - Has complaints of dry mouth, asked to start sipping water at bedside and to look into getting Biotene mouthwash. - Recommended trying to increase his compliance, which is low because he did not take the machine with him on a 2-week vacation. -Notes that he is having issues with mask leaking, will refer to mask fitting clinic.  Coronary artery disease, TIA, GERD, essential hypertension. - Sleep apnea can contribute to above issues, therefore adequate control of sleep apnea is important for their management.  Orders Placed This Encounter  Procedures  . Ambulatory Referral for DME  . Desensitization mask fit   Return in about 6 months (around 05/06/2018).    Date: 11/02/2017  MRN# [id number] Samuel Mason [birth year]    Samuel Mason is a 67 y.o. old male seen in consultation for chief complaint of:    Chief Complaint  Patient presents with  . Follow-up    pt thinks he has air leaks: if he takes off to go to restroom it seem to not titrate back to right pressure.  . Sleep Apnea    HPI:  He has been noticed that he has been having some trouble with leaks. He feels that the old one had a bit more pressure, he notes that when he disconnects it and reconnects he feels that the pressure is low.   **Download data reviewed 10/03/2017-11/01/2017>> data personally reviewed, usage greater than 4 hours is 14/30 days.  Average usage on days used was 4 hours 57 minutes.  Pressure ranges 8-15.  Median pressure 11, 95th percentile pressure 13, action pressure 14.  Residual AHI 8.7, central apnea index of 3.5.   -This shows an  inadequate compliance with CPAP with suboptimal control of obstructive sleep apnea. **HST 08/20/2017 AHI 27.  Started AutoPap 8-15. **Review of outside sleep study report from 08/30/2007.  This was a CPAP titration, patient required CPAP at a level of 10 with an AHI reduced to 0.  In the study the patient had a central apnea index of as high as 6.--  Medication:    Current Outpatient Medications:  .  amLODipine (NORVASC) 10 MG tablet, Take 15 mg by mouth daily. , Disp: , Rfl:  .  aspirin 81 MG tablet, Take 81 mg by mouth daily., Disp: , Rfl:  .  carvedilol (COREG) 12.5 MG tablet, Take 12.5 mg by mouth 2 (two) times daily with a meal., Disp: , Rfl:  .  carvedilol (COREG) 6.25 MG tablet, Take 6.25 mg by mouth 2 (two) times daily with a meal. , Disp: , Rfl:  .  celecoxib (CELEBREX) 200 MG capsule, Take 200 mg by mouth daily as needed for pain., Disp: , Rfl:  .  CRESTOR 10 MG tablet, Take 10 mg by mouth daily., Disp: , Rfl: 3 .  eplerenone (INSPRA) 50 MG tablet, Take 50 mg by mouth daily., Disp: , Rfl:  .  esomeprazole (NEXIUM) 20 MG capsule, Take 20 mg by mouth daily as needed. , Disp: , Rfl:  .  furosemide (LASIX) 20 MG tablet, Take 1 tablet (20 mg) by mouth once daily as needed for  increased shortness of breath/ swelling/ weight gain (Patient not taking: Reported on 09/28/2017), Disp: 30 tablet, Rfl: 6 .  losartan-hydrochlorothiazide (HYZAAR) 100-25 MG per tablet, Take 1 tablet by mouth daily., Disp: , Rfl:  .  Omega-3 Fatty Acids (FISH OIL PO), Take 350 mg by mouth daily., Disp: , Rfl:  .  ranitidine (ZANTAC) 150 MG capsule, Take 150 mg by mouth daily., Disp: , Rfl:    Allergies:  Simvastatin  Review of Systems:  Constitutional: Feels well. Cardiovascular: No chest pain.  Pulmonary: Denies dyspnea.   The remainder of systems were reviewed and were found to be negative other than what is documented in the HPI.    Physical Examination:   VS: BP 140/88 (BP Location: Left Arm, Cuff Size:  Large)   Pulse (!) 59   Resp 16   Ht 6\' 3"  (1.905 m)   Wt 279 lb (126.6 kg)   SpO2 99%   BMI 34.87 kg/m   General Appearance: No distress  Neuro:without focal findings, mental status, speech normal, alert and oriented HEENT: PERRLA, EOM intact Pulmonary: No wheezing, No rales  CardiovascularNormal S1,S2.  No m/r/g.  Abdomen: Benign, Soft, non-tender, No masses Renal:  No costovertebral tenderness  GU:  No performed at this time. Endoc: No evident thyromegaly, no signs of acromegaly or Cushing features Skin:   warm, no rashes, no ecchymosis  Extremities: normal, no cyanosis, clubbing.      LABORATORY PANEL:   CBC No results for input(s): WBC, HGB, HCT, PLT in the last 168 hours. ------------------------------------------------------------------------------------------------------------------  Chemistries  No results for input(s): NA, K, CL, CO2, GLUCOSE, BUN, CREATININE, CALCIUM, MG, AST, ALT, ALKPHOS, BILITOT in the last 168 hours.  Invalid input(s): GFRCGP ------------------------------------------------------------------------------------------------------------------  Cardiac Enzymes No results for input(s): TROPONINI in the last 168 hours. ------------------------------------------------------------  RADIOLOGY:  No results found.     Thank  you for the consultation and for allowing [facility] to assist in the care of your patient. Our recommendations are noted above.  Please contact us if we can be of further service.   Marda Stalker, M.D., F.C.C.P.  Board Certified in Internal Medicine, Pulmonary Medicine, [place], and Sleep Medicine.  [facility] Office Number: [phone]   11/02/2017

## 2017-11-03 ENCOUNTER — Ambulatory Visit (INDEPENDENT_AMBULATORY_CARE_PROVIDER_SITE_OTHER): Payer: Medicare Other | Admitting: Internal Medicine

## 2017-11-03 ENCOUNTER — Encounter: Payer: Self-pay | Admitting: Internal Medicine

## 2017-11-03 VITALS — BP 140/88 | HR 59 | Resp 16 | Ht 75.0 in | Wt 279.0 lb

## 2017-11-03 DIAGNOSIS — G4733 Obstructive sleep apnea (adult) (pediatric): Secondary | ICD-10-CM

## 2017-11-03 DIAGNOSIS — I25119 Atherosclerotic heart disease of native coronary artery with unspecified angina pectoris: Secondary | ICD-10-CM

## 2017-11-03 NOTE — Patient Instructions (Addendum)
For dry mouth can dry biotene mouthwash, and keep a glass of water by bedside to sip on.  Will increase pressure range to 10-16 cm H2O, call us back if it is too high.  Will refer to mask fitting clinic.

## 2017-11-06 ENCOUNTER — Telehealth: Payer: Self-pay | Admitting: Internal Medicine

## 2017-11-06 DIAGNOSIS — G4733 Obstructive sleep apnea (adult) (pediatric): Secondary | ICD-10-CM

## 2017-11-06 NOTE — Telephone Encounter (Signed)
Please advise on message below.

## 2017-11-06 NOTE — Telephone Encounter (Signed)
Pt called and stated that recently changed pressure is too strong and blows to hard. Pt is requesting to go back to previous CPAP pressure of 8-15 cm H20.  Please place order for [facility] to change CPAP pressure back to previous range.  Explained to patient that Dr. Ashby Dawes will be back on Monday 11/09/17. Rhonda J Cobb

## 2017-11-09 NOTE — Telephone Encounter (Signed)
He felt that 8-15 was too low, and 10-16 was too high. So change him to 9-15.

## 2017-11-09 NOTE — Telephone Encounter (Signed)
New order placed for CPAP settings to be changed to 9-15.

## 2017-11-18 ENCOUNTER — Ambulatory Visit (HOSPITAL_BASED_OUTPATIENT_CLINIC_OR_DEPARTMENT_OTHER): Payer: Medicare Other | Attending: Internal Medicine | Admitting: Radiology

## 2017-11-18 DIAGNOSIS — G4733 Obstructive sleep apnea (adult) (pediatric): Secondary | ICD-10-CM

## 2017-11-19 NOTE — Telephone Encounter (Signed)
Patient in lobby and wants to speak with someone preferably rhonda about sleep machine settings.

## 2017-11-19 NOTE — Telephone Encounter (Signed)
Pt stopped by office he went to mask fit and had Lynnae Sandhoff try different setting for him while in lab. Pt request that pressures be changed to 7-12 cmH20. Please advise.

## 2017-11-25 ENCOUNTER — Encounter: Payer: Self-pay | Admitting: Internal Medicine

## 2017-11-27 NOTE — Telephone Encounter (Signed)
Please advise on message below about CPAP setting changes.

## 2017-11-27 NOTE — Telephone Encounter (Signed)
Patient came by office  Patient would like to wait to change settings on CPAP machine til he see Dr. Juanell Fairly on 8/22 He received a new face mask and will like to test that first

## 2017-11-30 ENCOUNTER — Encounter: Payer: Self-pay | Admitting: Internal Medicine

## 2017-11-30 NOTE — Telephone Encounter (Signed)
Pt has f/u scheduled for 12/01/17.

## 2017-11-30 NOTE — Telephone Encounter (Signed)
Pt has already been through several small adjustments back and forther. No further adjustments until pt is seen.

## 2017-12-01 ENCOUNTER — Encounter: Payer: Self-pay | Admitting: Internal Medicine

## 2017-12-01 ENCOUNTER — Ambulatory Visit: Payer: Medicare Other | Admitting: Internal Medicine

## 2017-12-01 ENCOUNTER — Ambulatory Visit (INDEPENDENT_AMBULATORY_CARE_PROVIDER_SITE_OTHER): Payer: Medicare Other | Admitting: Internal Medicine

## 2017-12-01 VITALS — BP 126/78 | HR 99 | Resp 16 | Ht 75.0 in | Wt 273.0 lb

## 2017-12-01 DIAGNOSIS — I25119 Atherosclerotic heart disease of native coronary artery with unspecified angina pectoris: Secondary | ICD-10-CM

## 2017-12-01 DIAGNOSIS — G4733 Obstructive sleep apnea (adult) (pediatric): Secondary | ICD-10-CM

## 2017-12-01 NOTE — Progress Notes (Signed)
[facility] Consultation      Assessment and Plan:  Obstructive sleep apnea. -History of obstructive sleep apnea, was previously on CPAP at a pressure of 10, decreased to 8 which was too low, then increased to 10-16, currently which is too high. --Will decrease pressure range to 5-10 to allow him to acclimate better.  --Will send for cpap titration study, and change to bipap if needed.  - Has complaints of dry mouth, asked to start sipping water at bedside and to look into getting Biotene mouthwash.   Coronary artery disease, TIA, GERD, essential hypertension. - Sleep apnea can contribute to above issues, therefore adequate control of sleep apnea is important for their management.  Orders Placed This Encounter  Procedures  . Cpap titration     Date: 12/01/2017  MRN# [id number] Samuel Mason Jun 12, 1950    Samuel Mason is a 67 y.o. old male seen in consultation for chief complaint of:    Chief Complaint  Patient presents with  . Follow-up    cpap compliance:pt states too much air blowing in which after about 3 hrs he has chest pain. Spouse witnessed snoring and gasping.    HPI:  The patient is a 67 year old male with a history of sleep apnea.  Previously the patient was on a CPAP pressure of 10 but was causing some chest discomfort, therefore decreased to 8.  He felt the pressure was too low at that time, therefore he was increased to a range of 10-16, this was too high this was changed subsequently to 9-15.  Today he feels that the pressure is again too high.  Wife is present as well and notes that occasionally he has apneas and snoring while wearing his CPAP mask.  He has been referred to mask fitting clinic and was changed to a different mask which has been more comfortable.  **Download data reviewed 11/01/2017-11/30/2017>> usage greater than 4 hours is 15/30 days.  Average usage on days used is 432 minutes.  Pressure ranges 9-15.  Median pressure 11, 95th  percentile pressure 13, maximum pressure 14.  Residual AHI is elevated at 14.7.  This shows borderline compliance with adequate though not ideal control of obstructive sleep apnea. **Download data reviewed 10/03/2017-11/01/2017>> data personally reviewed, usage greater than 4 hours is 14/30 days.  Average usage on days used was 4 hours 57 minutes.  Pressure ranges 8-15.  Median pressure 11, 95th percentile pressure 13, action pressure 14.  Residual AHI 8.7, central apnea index of 3.5.   -This shows an inadequate compliance with CPAP with suboptimal control of obstructive sleep apnea. **HST 08/20/2017 AHI 27.  Started AutoPap 8-15. **Review of outside sleep study report from 08/30/2007.  This was a CPAP titration, patient required CPAP at a level of 10 with an AHI reduced to 0.  In the study the patient had a central apnea index of as high as 6.--  Medication:    Current Outpatient Medications:  .  amLODipine (NORVASC) 10 MG tablet, Take 15 mg by mouth daily. , Disp: , Rfl:  .  aspirin 81 MG tablet, Take 81 mg by mouth daily., Disp: , Rfl:  .  carvedilol (COREG) 12.5 MG tablet, Take 12.5 mg by mouth 2 (two) times daily with a meal., Disp: , Rfl:  .  carvedilol (COREG) 6.25 MG tablet, Take 6.25 mg by mouth 2 (two) times daily with a meal. , Disp: , Rfl:  .  celecoxib (CELEBREX) 200 MG capsule, Take 200 mg by  mouth daily as needed for pain., Disp: , Rfl:  .  CRESTOR 10 MG tablet, Take 10 mg by mouth daily., Disp: , Rfl: 3 .  eplerenone (INSPRA) 50 MG tablet, Take 50 mg by mouth daily., Disp: , Rfl:  .  esomeprazole (NEXIUM) 20 MG capsule, Take 20 mg by mouth daily as needed. , Disp: , Rfl:  .  furosemide (LASIX) 20 MG tablet, Take 1 tablet (20 mg) by mouth once daily as needed for increased shortness of breath/ swelling/ weight gain, Disp: 30 tablet, Rfl: 6 .  losartan-hydrochlorothiazide (HYZAAR) 100-25 MG per tablet, Take 1 tablet by mouth daily., Disp: , Rfl:  .  Omega-3 Fatty Acids (FISH OIL PO), Take  350 mg by mouth daily., Disp: , Rfl:  .  ranitidine (ZANTAC) 150 MG capsule, Take 150 mg by mouth daily., Disp: , Rfl:    Allergies:  Simvastatin   Review of Systems:  Constitutional: Feels well. Cardiovascular: No chest pain.  Pulmonary: Denies dyspnea.   The remainder of systems were reviewed and were found to be negative other than what is documented in the HPI.    Physical Examination:   VS: BP 126/78 (BP Location: Left Arm, Cuff Size: Large)   Pulse 99   Resp 16   Ht 6\' 3"  (1.905 m)   Wt 273 lb (123.8 kg)   SpO2 100%   BMI 34.12 kg/m   General Appearance: No distress  Neuro:without focal findings, mental status, speech normal, alert and oriented HEENT: PERRLA, EOM intact Pulmonary: No wheezing, No rales  CardiovascularNormal S1,S2.  No m/r/g.  Abdomen: Benign, Soft, non-tender, No masses Renal:  No costovertebral tenderness  GU:  No performed at this time. Endoc: No evident thyromegaly, no signs of acromegaly or Cushing features Skin:   warm, no rashes, no ecchymosis  Extremities: normal, no cyanosis, clubbing.       LABORATORY PANEL:   CBC No results for input(s): WBC, HGB, HCT, PLT in the last 168 hours. ------------------------------------------------------------------------------------------------------------------  Chemistries  No results for input(s): NA, K, CL, CO2, GLUCOSE, BUN, CREATININE, CALCIUM, MG, AST, ALT, ALKPHOS, BILITOT in the last 168 hours.  Invalid input(s): GFRCGP ------------------------------------------------------------------------------------------------------------------  Cardiac Enzymes No results for input(s): TROPONINI in the last 168 hours. ------------------------------------------------------------  RADIOLOGY:  No results found.     Thank  you for the consultation and for allowing [facility] to assist in the care of your patient. Our recommendations are noted above.  Please contact us if  we can be of further service.   Marda Stalker, M.D., F.C.C.P.  Board Certified in Internal Medicine, Pulmonary Medicine, [place], and Sleep Medicine.  [facility] Office Number: [phone]   12/01/2017

## 2017-12-01 NOTE — Patient Instructions (Addendum)
Will change Auto-CPAP pressure to range of 5-10 cm.  Will refer for CPAP titration study.

## 2017-12-10 ENCOUNTER — Ambulatory Visit: Payer: Medicare Other | Admitting: Internal Medicine

## 2017-12-17 ENCOUNTER — Ambulatory Visit: Payer: Medicare Other | Attending: Internal Medicine

## 2017-12-17 DIAGNOSIS — G4733 Obstructive sleep apnea (adult) (pediatric): Secondary | ICD-10-CM | POA: Insufficient documentation

## 2018-01-05 ENCOUNTER — Telehealth: Payer: Self-pay | Admitting: *Deleted

## 2018-01-05 DIAGNOSIS — Z9989 Dependence on other enabling machines and devices: Principal | ICD-10-CM

## 2018-01-05 DIAGNOSIS — G4733 Obstructive sleep apnea (adult) (pediatric): Secondary | ICD-10-CM

## 2018-01-05 NOTE — Telephone Encounter (Signed)
Left detailed message that titration study results received and order placed to change pressures 9-11 cmH20.

## 2018-01-28 ENCOUNTER — Ambulatory Visit: Payer: Medicare Other | Admitting: Podiatry

## 2018-06-17 ENCOUNTER — Encounter: Payer: Self-pay | Admitting: Podiatry

## 2018-06-17 ENCOUNTER — Ambulatory Visit (INDEPENDENT_AMBULATORY_CARE_PROVIDER_SITE_OTHER): Payer: Medicare Other | Admitting: Podiatry

## 2018-06-17 DIAGNOSIS — M79675 Pain in left toe(s): Secondary | ICD-10-CM

## 2018-06-17 DIAGNOSIS — M79674 Pain in right toe(s): Secondary | ICD-10-CM | POA: Diagnosis not present

## 2018-06-17 DIAGNOSIS — B351 Tinea unguium: Secondary | ICD-10-CM

## 2018-06-17 NOTE — Progress Notes (Signed)
Complaint:  Visit Type: Patient returns to my office for continued preventative foot care services. Complaint: Patient states" my nails have grown long and thick and become painful to walk and wear shoes"  The patient presents for preventative foot care services. No changes to ROS  Podiatric Exam: Vascular: dorsalis pedis and posterior tibial pulses are palpable bilateral. Capillary return is immediate. Temperature gradient is WNL. Skin turgor WNL  Sensorium: Normal Semmes Weinstein monofilament test. Normal tactile sensation bilaterally. Nail Exam: Pt has thick disfigured discolored nails with subungual debris noted bilateral entire nail hallux through fifth toenails.     Ulcer Exam: There is no evidence of ulcer or pre-ulcerative changes or infection. Orthopedic Exam: Muscle tone and strength are WNL. No limitations in general ROM. No crepitus or effusions noted. Foot type and digits show no abnormalities. Bony prominences are unremarkable. Skin: No Porokeratosis. No infection or ulcers  Diagnosis:  Onychomycosis, , Pain in right toe, pain in left toes  Treatment & Plan Procedures and Treatment: Consent by patient was obtained for treatment procedures.   Debridement of mycotic and hypertrophic toenails, 1 through 5 bilateral and clearing of subungual debris. No ulceration, no infection noted.  Return Visit-Office Procedure: Patient instructed to return to the office for a follow up visit 6   months for continued evaluation and treatment.    Gardiner Barefoot DPM

## 2018-08-10 ENCOUNTER — Telehealth: Payer: Self-pay

## 2018-08-10 NOTE — Telephone Encounter (Signed)
LVM for pt to call office to schedule his screening colonoscopy.  When he calls back, please let him know that Sharyn Lull will be calling him back from a restricted number due to working from home.  Does not take calls on his home phone from restricted phone numbers.  Thanks [employer]

## 2018-08-25 ENCOUNTER — Encounter: Payer: Self-pay | Admitting: *Deleted

## 2018-08-31 ENCOUNTER — Telehealth: Payer: Self-pay | Admitting: Gastroenterology

## 2018-08-31 NOTE — Telephone Encounter (Signed)
Pt is returning a call for Sharyn Lull to schedule procedure he is aware of Private #

## 2018-09-01 ENCOUNTER — Telehealth: Payer: Self-pay | Admitting: Gastroenterology

## 2018-09-01 NOTE — Telephone Encounter (Signed)
Pt is calling for Sharyn Lull please call his home # [phone]

## 2018-09-01 NOTE — Telephone Encounter (Signed)
LVM for pt to call office to schedule colonoscopy.  Thanks [employer]

## 2018-09-02 NOTE — Telephone Encounter (Signed)
Patients call was returned to schedule his screening colonoscopy.  During our conversation, he stated that he has some intolerance to spicy foods and he and his PCP discussed having an EGD performed with the colonoscopy.  Explained to patient that I am unable to schedule for the EGD without an office visit.  He understood and has been scheduled for a New Patient Visit you to address his needs for having an EGD.  The "Pending Date" for Colonoscopy is  10/12/18 with you at [facility].   His "New Patient Appt" is scheduled for 09/21/18.  Informed him that we would schedule his procedures with you after his appt to confirm the need for EGD.  Thanks [employer]

## 2018-09-02 NOTE — Telephone Encounter (Signed)
Pt is calling again to schedule colonoscopy   cb [phone]

## 2018-09-21 ENCOUNTER — Telehealth: Payer: Self-pay

## 2018-09-21 ENCOUNTER — Ambulatory Visit (INDEPENDENT_AMBULATORY_CARE_PROVIDER_SITE_OTHER): Payer: Medicare Other | Admitting: Gastroenterology

## 2018-09-21 ENCOUNTER — Ambulatory Visit: Payer: Medicare Other | Admitting: Gastroenterology

## 2018-09-21 ENCOUNTER — Telehealth: Payer: Self-pay | Admitting: Internal Medicine

## 2018-09-21 ENCOUNTER — Other Ambulatory Visit: Payer: Self-pay

## 2018-09-21 VITALS — BP 117/79 | HR 64 | Temp 97.9°F | Ht 75.0 in | Wt 282.8 lb

## 2018-09-21 DIAGNOSIS — Z86010 Personal history of colonic polyps: Secondary | ICD-10-CM | POA: Diagnosis not present

## 2018-09-21 DIAGNOSIS — K219 Gastro-esophageal reflux disease without esophagitis: Secondary | ICD-10-CM

## 2018-09-21 NOTE — Telephone Encounter (Signed)
Received surgical clearance from [facility].  Pt last seen 12/01/17 and was instructed to f/u in 88mo. Pt does not currently have a pending appt.  I have left a message with pt's spouse, requesting that pt give our office a call to schedule appt.

## 2018-09-21 NOTE — Telephone Encounter (Signed)
Thank you.  Pre-op covering staff: - Please schedule appointment (in office with EKG) and call patient to inform them. - Please contact requesting surgeon's office via preferred method (i.e, phone, fax) to inform them of need for appointment prior to surgery.  I see message was routed to [place], but I was unsure if she was aware of the typical pre-op protocol so will route to callback staff to connect with [facility] team tomorrow when office reopens.  Charlie Pitter, PA-C  09/21/2018, 5:35 PM

## 2018-09-21 NOTE — Telephone Encounter (Signed)
   Primary Cardiologist:Muhammad Fletcher Anon, MD  Chart reviewed as part of pre-operative protocol coverage. Because of Samuel Mason's past medical history and time since last visit, he will require a follow-up visit in order to better assess preoperative cardiovascular risk. Colonoscopy is generally low risk but patient has not been seen in over a year, and there is time to arrange OV to review clearance and general cardiac health prior to procedure. Will route to Dr. Fletcher Anon to find out if he is OK with virtual visit, since patient will not have had EKG since 08/2017 - or if he should be scheduled for in-office visit.   Dr. Fletcher Anon - - Please route response to P CV DIV PREOP (the pre-op pool). Thank you.  Charlie Pitter, PA-C  09/21/2018, 3:40 PM

## 2018-09-21 NOTE — Telephone Encounter (Signed)
   [facility] Pre-operative Risk Assessment    Request for surgical clearance:  1. What type of surgery is being performed? Colonoscopy   2. When is this surgery scheduled? TBD (10-12-18)  3. What type of clearance is required (medical clearance vs. Pharmacy clearance to hold med vs. Both)? Medical Clearance  4. Are there any medications that need to be held prior to surgery and how long? None   5. Practice name and name of physician performing surgery?  [facility], Dr. Jonathon Bellows   6. What is your office phone number [phone]    7.   What is your office fax number [phone]  8.   Anesthesia type (None, local, MAC, general) ? General   Rushie Chestnut 09/21/2018, 2:42 PM  _________________________________________________________________   (provider comments below)

## 2018-09-21 NOTE — Telephone Encounter (Signed)
The patient requires an office visit with EKG for clearance.

## 2018-09-21 NOTE — Patient Instructions (Signed)

## 2018-09-21 NOTE — Progress Notes (Signed)
Jonathon Bellows MD, MRCP(U.K) [address]  [place]  [place], [place] [address]  Main: [phone]  Fax: [phone]   Gastroenterology Consultation  Referring Provider:     [name], MD Primary Care Physician:  [name], MD Primary Gastroenterologist:  Dr. Jonathon Bellows  Reason for Consultation:     EGD        HPI:   Samuel Mason is a 68 y.o. y/o male referred for consultation & management  by Dr. [name], MD.     He has asked for the appointment to discuss about an EGD. He is also due to have a screening colonosocpy , last was 5 years back, did have polyps in the past and was told to return in 5 years. No rectal bleeding or lower GI symptoms.   He says that for the past few years has had acid reflux. Cant do dairy products as it causes gas. Was taking nexium in the past. Did have heartburn. Presently mild heartburn on occasions, not all the time. Denies any pain while swallowing or odynophagia. GERD symptoms for last 3 years. Not a smoker, no esophageal cancer in the family  Past Medical History:  Diagnosis Date  . Asymptomatic PVCs    a. 07/2012 Holter: 7965 PVCs in 48 hrs; b. 07/2015 24hr Holter: 6000 PVCs (7%).  [name] CAD (coronary artery disease)    a. 2009 Cath: nonobs dzs; b. 02/2010 Cath: LAD 49m, D1 90, LCX 61m, RCA min irregs, EF 60%-->Med Rx; c. 07/2012 Neg ETT; d. 05/2015 Neg MV. EF 73%.  . Carotid arterial disease ([name])    a. 11/2016 Carotid u/s: mild bilat carotid plaques.  . CKD (chronic kidney disease), stage III ([place])    a. Creat 1.67 05/22/2017.  [name] GERD (gastroesophageal reflux disease)    a. noncompliant w/ PPI.  [name] History of echocardiogram    a. 09/2016 Echo: EF 60-65%, no orwma, mild MR, mildly dil LA/RA.  [name] Hyperlipidemia   . Hypertension   . Morbid obesity ([place])   . Obstructive sleep apnea    a. noncompliant w/ CPAP.  [name] TIA (transient ischemic attack)     Past Surgical History:  Procedure Laterality Date  . CARDIAC CATHETERIZATION   2011   50% mid LAD stenosis, 40% proximal LCX, EF 60%  . CARDIAC CATHETERIZATION  2009   Mild CAD              Prior to Admission medications   Medication Sig Start Date End Date Taking? Authorizing Provider  amLODipine (NORVASC) 10 MG tablet Take 15 mg by mouth daily.     [provider]  aspirin 81 MG tablet Take 81 mg by mouth daily.    [provider]  carvedilol (COREG) 12.5 MG tablet Take 12.5 mg by mouth 2 (two) times daily with a meal.    [provider]  carvedilol (COREG) 6.25 MG tablet Take 6.25 mg by mouth 2 (two) times daily with a meal.     [provider]  celecoxib (CELEBREX) 200 MG capsule Take 200 mg by mouth daily as needed for pain.    [provider]  chlorhexidine (PERIDEX) 0.12 % solution  04/07/18   [provider]  CRESTOR 10 MG tablet Take 10 mg by mouth daily. 06/27/15   [provider]  eplerenone (INSPRA) 50 MG tablet Take 50 mg by mouth daily.    [provider]  esomeprazole (NEXIUM) 20 MG capsule Take 20 mg by mouth  daily as needed.     [provider]  famotidine (PEPCID) 20 MG tablet Take by mouth. 06/14/18 06/14/19  [provider]  furosemide (LASIX) 20 MG tablet Take 1 tablet (20 mg) by mouth once daily as needed for increased shortness of breath/ swelling/ weight gain 09/17/17   Deboraha Sprang, MD  ibuprofen (ADVIL,MOTRIN) 600 MG tablet  04/12/18   [provider]  losartan-hydrochlorothiazide (HYZAAR) 100-25 MG per tablet Take 1 tablet by mouth daily.    [provider]  Multiple Vitamins-Minerals (MULTIVITAMIN ADULTS PO) Take by mouth.    [provider]  Omega-3 Fatty Acids (FISH OIL PO) Take 350 mg by mouth daily.    [provider]  omeprazole (PRILOSEC) 20 MG capsule Take 20 mg by mouth daily. 05/26/18   [provider]  ranitidine (ZANTAC) 150 MG capsule Take 150 mg by mouth daily. 08/27/15   [provider]   UNABLE TO FIND Take by mouth. 07/17/10   [provider]    Family History  Problem Relation Age of Onset  . Heart attack Father        MI     Social History   Tobacco Use  . Smoking status: Never Smoker  . Smokeless tobacco: Never Used  Substance Use Topics  . Alcohol use: No    Alcohol/week: 0.0 standard drinks    Comment: socail drinker  . Drug use: No    Allergies as of 09/21/2018 - Review Complete 06/17/2018  Allergen Reaction Noted  . Simvastatin      Review of Systems:    All systems reviewed and negative except where noted in HPI.   Physical Exam:  BP 117/79   Pulse 64   Temp 97.9 F (36.6 C)   Ht 6\' 3"  (1.905 m)   Wt 282 lb 12.8 oz (128.3 kg)   BMI 35.35 kg/m  No LMP for male patient. Psych:  Alert and cooperative. Normal mood and affect. General:   Alert,  Well-developed, well-nourished, pleasant and cooperative in NAD Head:  Normocephalic and atraumatic. Eyes:  Sclera clear, no icterus.   Conjunctiva pink. Ears:  Normal auditory acuity. Nose:  No deformity, discharge, or lesions. Mouth:  No deformity or lesions,oropharynx pink & moist. Neck:  Supple; no masses or thyromegaly. Lungs:  Respirations even and unlabored.  Clear throughout to auscultation.   No wheezes, crackles, or rhonchi. No acute distress. Heart:  Regular rate and rhythm; no murmurs, clicks, rubs, or gallops. Abdomen:  Normal bowel sounds.  No bruits.  Soft, non-tender and non-distended without masses, hepatosplenomegaly or hernias noted.  No guarding or rebound tenderness.    Neurologic:  Alert and oriented x3;  grossly normal neurologically. Skin:  Intact without significant lesions or rashes. No jaundice. Lymph Nodes:  No significant cervical adenopathy. Psych:  Alert and cooperative. Normal mood and affect.  Imaging Studies: No results found.  Assessment and Plan:   Samuel Mason is a 68 y.o. y/o male has been referred for an EGD to be performed along with his  screening colonoscopy. Based on history of being a non smoker, African americam , symptoms of only 3 years duration- he is low risk for barrettes esophagus.   Plan  1. GERD- patient information . PRN use of Pepcid/TUMS since symptoms are occasional. Counseled on life styel changes,  2.colonoscopy - surveillance due to prior history of colon polyps  I have discussed alternative options, risks & benefits,  which include, but are not limited  to, bleeding, infection, perforation,respiratory complication & drug reaction.  The patient agrees with this plan & written consent will be obtained.     Follow up PRN  Dr Jonathon Bellows MD,MRCP(U.K)

## 2018-09-22 NOTE — Telephone Encounter (Signed)
Noted.  Will close encounter at this time 

## 2018-09-22 NOTE — Telephone Encounter (Signed)
Pt called back, let him know that we need to schedule an appt. Pt said that he would give [place] a call back.

## 2018-09-23 NOTE — Telephone Encounter (Signed)
Message rourted to [facility] scheduling to contact the pt to schedule.

## 2018-09-27 NOTE — Telephone Encounter (Signed)
lmov to schedule  °

## 2018-09-28 ENCOUNTER — Telehealth: Payer: Self-pay

## 2018-09-28 ENCOUNTER — Encounter: Payer: Self-pay | Admitting: Cardiovascular Disease

## 2018-09-28 ENCOUNTER — Ambulatory Visit (INDEPENDENT_AMBULATORY_CARE_PROVIDER_SITE_OTHER): Payer: Medicare Other | Admitting: Cardiovascular Disease

## 2018-09-28 ENCOUNTER — Other Ambulatory Visit: Payer: Self-pay

## 2018-09-28 VITALS — BP 130/74 | HR 54 | Ht 75.0 in | Wt 283.8 lb

## 2018-09-28 DIAGNOSIS — I493 Ventricular premature depolarization: Secondary | ICD-10-CM | POA: Diagnosis not present

## 2018-09-28 DIAGNOSIS — E785 Hyperlipidemia, unspecified: Secondary | ICD-10-CM | POA: Diagnosis not present

## 2018-09-28 DIAGNOSIS — I10 Essential (primary) hypertension: Secondary | ICD-10-CM

## 2018-09-28 DIAGNOSIS — Z01810 Encounter for preprocedural cardiovascular examination: Secondary | ICD-10-CM | POA: Diagnosis not present

## 2018-09-28 NOTE — Progress Notes (Signed)
Cardiology Office Note   Date:  09/28/2018   ID:  Samuel Mason, DOB 03/12/1951, MRN [id number]  PCP:  [name], MD  Cardiologist:   [name], MD   Chief Complaint  Patient presents with  . other    Patient denies chest pain and SOB at this time. Meds reviewed verbally with patient.       History of Present Illness: Samuel Mason is a 68 y.o. male who presents for a followup visit regarding hypertension and frequent asymptomatic PVCs.  He also needs preop evaluation for colonoscopy. He is known to have refractory hypertension with no evidence of renal artery stenosis. He also suffers from chronic chest pain likely musculoskeletal with cardiac catheterization done twice in 2009 and 2011. Both times there was mild to moderate CAD without evidence of obstructive disease. He also has known history of sleep apnea does not tolerate CPAP well. He was first noted to have frequent PVCs years ago during his sleep study and has been treated medically with a beta blocker. He did not tolerate the higher dose of carvedilol due to fatigue and bradycardia. Echocardiogram 07/2012  showed normal LV systolic function with only mild left ventricular hypertrophy. 4 Most recent treadmill nuclear stress test in February 2019 showed no  evidence of ischemia although he did have frequent PVCs during exercise and in recovery.  He was seen by Dr. Caryl Comes for frequent PVCs.  Carvedilol was increased with subsequent improvement.  He has been doing well and denies any  shortness of breath, orthopnea, PND or palpitations.  He has chronic atypical chest pain with no change recently.  He has mild bilateral leg edema.  He takes his medications regularly.  He typically takes 3 tablets of carvedilol daily.  He attempted to take 12.5 mg twice daily but did not tolerate very well due to fatigue.  Past Medical History:  Diagnosis Date  . Asymptomatic PVCs    a. 07/2012 Holter: 7965 PVCs in 48 hrs; b. 07/2015 24hr  Holter: 6000 PVCs (7%).  [name] CAD (coronary artery disease)    a. 2009 Cath: nonobs dzs; b. 02/2010 Cath: LAD 17m, D1 90, LCX 16m, RCA min irregs, EF 60%-->Med Rx; c. 07/2012 Neg ETT; d. 05/2015 Neg MV. EF 73%.  . Carotid arterial disease ([place])    a. 11/2016 Carotid u/s: mild bilat carotid plaques.  . CKD (chronic kidney disease), stage III ([place])    a. Creat 1.67 05/22/2017.  [name] GERD (gastroesophageal reflux disease)    a. noncompliant w/ PPI.  [name] History of echocardiogram    a. 09/2016 Echo: EF 60-65%, no orwma, mild MR, mildly dil LA/RA.  [name] Hyperlipidemia   . Hypertension   . Morbid obesity ([place])   . Obstructive sleep apnea    a. noncompliant w/ CPAP.  [name] TIA (transient ischemic attack)     Past Surgical History:  Procedure Laterality Date  . CARDIAC CATHETERIZATION  2011   50% mid LAD stenosis, 40% proximal LCX, EF 60%  . CARDIAC CATHETERIZATION  2009   Mild CAD               Current Outpatient Medications  Medication Sig Dispense Refill  . amLODipine (NORVASC) 10 MG tablet Take 10 mg by mouth daily. Take 1.5 tablet daily    . aspirin 81 MG tablet Take 81 mg by mouth daily.    . carvedilol (COREG) 6.25 MG tablet Take 6.25 mg by mouth 2 (two) times daily with a meal.     .  CRESTOR 10 MG tablet Take 10 mg by mouth daily.  3  . eplerenone (INSPRA) 50 MG tablet Take 50 mg by mouth daily.    . famotidine (PEPCID) 20 MG tablet Take by mouth.    [name] ibuprofen (ADVIL,MOTRIN) 600 MG tablet     . losartan-hydrochlorothiazide (HYZAAR) 100-25 MG per tablet Take 1 tablet by mouth daily.    . Multiple Vitamins-Minerals (MULTIVITAMIN ADULTS PO) Take by mouth.    . Omega-3 Fatty Acids (FISH OIL PO) Take 350 mg by mouth daily.     No current facility-administered medications for this visit.     Allergies:   Simvastatin    Social History:  The patient  reports that he has never smoked. He has never used smokeless tobacco. He reports that he does not drink alcohol or use drugs.   Family History:   The patient's family history includes Heart attack in his father.    ROS:  Please see the history of present illness.   Otherwise, review of systems are positive for none.   All other systems are reviewed and negative.    PHYSICAL EXAM: VS:  BP 130/74 (BP Location: Left Arm, Patient Position: Sitting, Cuff Size: Large)   Pulse (!) 54   Ht 6\' 3"  (1.905 m)   Wt 283 lb 12 oz (128.7 kg)   BMI 35.47 kg/m  , BMI Body mass index is 35.47 kg/m. GEN: Well nourished, well developed, in no acute distress  HEENT: normal  Neck: no JVD, carotid bruits, or masses Cardiac: RRR with premature beats; no murmurs, rubs, or gallops,no edema  Respiratory:  clear to auscultation bilaterally, normal work of breathing GI: soft, nontender, nondistended, + BS MS: no deformity or atrophy  Skin: warm and dry, no rash Neuro:  Strength and sensation are intact Psych: euthymic mood, full affect   EKG:  EKG is ordered today. The ekg ordered today demonstrates sinus bradycardia with first-degree AV block and a PVC.  Poor R wave progression anterior leads   Recent Labs: No results found for requested labs within last 8760 hours.    Lipid Panel    Component Value Date/Time   CHOL 234 (HH) 06/22/2006 1553   TRIG 91 06/22/2006 1553   HDL 36.4 (L) 06/22/2006 1553   CHOLHDL 6.4 CALC 06/22/2006 1553   VLDL 18 06/22/2006 1553   LDLDIRECT 183.4 06/22/2006 1553      Wt Readings from Last 3 Encounters:  09/28/18 283 lb 12 oz (128.7 kg)  09/21/18 282 lb 12.8 oz (128.3 kg)  12/01/17 273 lb (123.8 kg)         ASSESSMENT AND PLAN:  1. Asymptomatic PVCs: Frequency improved with increasing carvedilol.  He seems to be asymptomatic from this and his LV systolic function is normal.   2.  Preop cardiovascular evaluation for colonoscopy: The patient has no symptoms suggestive of angina or heart failure with good functional capacity.  Stress test last year was normal.  He can proceed at an overall low risk.  He  can hold aspirin 5 to 7 days before the procedure.  3. Refractory hypertension: Blood pressure is well controlled on current medications.   4. Hyperlipidemia: Continue treatment with rosuvastatin with a target LDL of less than 70. This is followed by Dr. Rosario Jacks.     Disposition:   FU with me in 1 year  Signed,  [name], MD  09/28/2018 2:15 PM    [employer]

## 2018-09-28 NOTE — Telephone Encounter (Signed)
Patient also needs office visit - Dr Fletcher Anon does not have anything available in June Samuel Mason has availability tomorrow for provider use - would it be ok to schedule 6/10 at 2pm?

## 2018-09-28 NOTE — Patient Instructions (Signed)
Medication Instructions:  No changes  If you need a refill on your cardiac medications before your next appointment, please call your pharmacy.   Lab work: None at this time  If you have labs (blood work) drawn today and your tests are completely normal, you will receive your results only by: [name] MyChart Message (if you have MyChart) OR . A paper copy in the mail If you have any lab test that is abnormal or we need to change your treatment, we will call you to review the results.  Testing/Procedures: None  Follow-Up: At [facility], you and your health needs are our priority.  As part of our continuing mission to provide you with exceptional heart care, we have created designated Provider Care Teams.  These Care Teams include your primary Cardiologist (physician) and Advanced Practice Providers (APPs -  Physician Assistants and Nurse Practitioners) who all work together to provide you with the care you need, when you need it. You will need a follow up appointment in 12 months.  Please call our office 2 months in advance to schedule this appointment.  You may see [name], MD or one of the following Advanced Practice Providers on your designated Care Team:   Murray Hodgkins, NP Christell Faith, PA-C . Marrianne Mood, PA-C  Any Other Special Instructions Will Be Listed Below (If Applicable).

## 2018-09-28 NOTE — Telephone Encounter (Signed)
Pt sch for today 6/9 @2pm  with Dr.Arida

## 2018-09-28 NOTE — Telephone Encounter (Signed)
Called pt schedule colonoscopy. Pt requested that we call him tomorrow to schedule.

## 2018-09-28 NOTE — Telephone Encounter (Signed)
-----   Message from Jonathon Bellows, MD sent at 09/28/2018  2:39 PM EDT ----- Thank you  Kiran ----- Message ----- From: Wellington Hampshire, MD Sent: 09/28/2018   2:31 PM EDT To: Jonathon Bellows, MD  Low risk for colonoscopy.

## 2018-09-29 ENCOUNTER — Ambulatory Visit: Payer: Medicare Other

## 2018-09-29 ENCOUNTER — Telehealth: Payer: Self-pay | Admitting: Internal Medicine

## 2018-09-29 NOTE — Telephone Encounter (Signed)
Patient called back & was returning Jadijah's call.

## 2018-09-29 NOTE — Telephone Encounter (Signed)
Made pt aware and he is ok with office visit. Nothing further needed.

## 2018-09-29 NOTE — Telephone Encounter (Signed)
I would recommend an office visit, so Dr. Ashby Dawes we can do a full evaluation of his pulmonary status. If he is refusing, can do a virtual.

## 2018-09-29 NOTE — Telephone Encounter (Signed)
Called pt to inform him that he'll still need the surgical clearance from his pulmonologist. It appears [facility] tried to reach out to offer pt a follow up appointment but pt did not schedule.  Unable to contact pt, LVM to return call.

## 2018-09-29 NOTE — Telephone Encounter (Signed)
Spoke with pt and informed him that he will need to be cleared by his pulmonologist before we can schedule the endoscopy procedure as he has obstructive sleep apnea. I explained that [facility] offered an office visit for this reason. Pt agrees and plans to contact [facility] to schedule.

## 2018-09-29 NOTE — Telephone Encounter (Signed)
Pt needs pulmonary clearance for scheduled colonoscopy. Went ahead and scheduled an office visit but pt wants to know if he can do a phone or vitual visit instead? I advised pt that I would need to confirm with nurse/doctor first considering what he's being seen for.

## 2018-10-01 NOTE — Progress Notes (Signed)
[facility] Consultation      Assessment and Plan:  Preoperative pulmonary exam. - Patient is at low to moderate risk of pulmonary complications with surgery. - He is optimized for surgery from respiratory standpoint.  Obstructive sleep apnea. -History of obstructive sleep apnea, was previously on CPAP at a pressure of 10, decreased to 8 which was too low, then increased to 10-16, currently which is too high. --That is post CPAP titration study which showed an ideal pressure 15, patient has subsequently stopped CPAP altogether as he felt it made his blood pressure go up, not willing to try CPAP further. --Discussed the options including mandibular advancement device versus hypoglossal nerve stimulation device.  He is going to consider these and we can give him a referral if/when he decides on proceeding.  Coronary artery disease, TIA, GERD, essential hypertension. - Sleep apnea can contribute to above issues, therefore adequate control of sleep apnea is important for their management.  Return as needed..    Date: 10/01/2018  MRN# [id number] Samuel Mason Oct 19, 1950    Samuel Mason is a 68 y.o. old male seen in consultation for chief complaint of:    Chief Complaint  Patient presents with  . Follow-up    needs pulmonary clearance for colonoscopy, using CPAP very little, makes his blood pressure higher    HPI:  Samuel Mason is a 68 y.o. male with a history of sleep apnea.  Previously the patient was on a CPAP pressure of 10 but was causing some chest discomfort, therefore decreased to 8.  He felt the pressure was too low at that time, therefore he was increased to a range of 10-16, this was too high this was changed subsequently to 9-15.   At last visit felt that the pressure was too high, therefore underwent a CPAP titration study.  On this the ideal pressure was 15, however highest tolerated pressure was 10, therefore recommended CPAP pressure range of  9-11. Patient was recently seen by gastroenterology, he is undergoing EGD along with a screening colonoscopy, he is here today predominantly for clearance for the procedure.  He denies respiratory issues, no cough or dyspnea. He is fairly active, he worked as a Psychologist, occupational, he walks a lot and goes to [employer]. He is not a smoker, his last procedure was a colonoscopy 5 years ago, no respiratory issues at that time. He has stopped using CPAP because his BP went up, when he stopped CPAP he felt that the BP got better.     **CPAP titration study 12/17/2017>> patient's ideal pressure was 15, however patient's highest tolerated pressure was 10.  Recommended auto CPAP with pressure range 9-11. **Download data reviewed 11/01/2017-11/30/2017>> usage greater than 4 hours is 15/30 days.  Average usage on days used is 432 minutes.  Pressure ranges 9-15.  Median pressure 11, 95th percentile pressure 13, maximum pressure 14.  Residual AHI is elevated at 14.7.  This shows borderline compliance with adequate though not ideal control of obstructive sleep apnea. **Download data reviewed 10/03/2017-11/01/2017>> data personally reviewed, usage greater than 4 hours is 14/30 days.  Average usage on days used was 4 hours 57 minutes.  Pressure ranges 8-15.  Median pressure 11, 95th percentile pressure 13, action pressure 14.  Residual AHI 8.7, central apnea index of 3.5.   -This shows an inadequate compliance with CPAP with suboptimal control of obstructive sleep apnea. **HST 08/20/2017 AHI 27.  Started AutoPap 8-15. **Review of outside sleep study report from 08/30/2007.  This was a CPAP titration, patient required CPAP at a level of 10 with an AHI reduced to 0.  In the study the patient had a central apnea index of as high as 6.--  Medication:    Current Outpatient Medications:  .  amLODipine (NORVASC) 10 MG tablet, Take 10 mg by mouth daily. Take 1.5 tablet daily, Disp: , Rfl:  .  aspirin 81 MG tablet, Take 81 mg by mouth daily.,  Disp: , Rfl:  .  carvedilol (COREG) 6.25 MG tablet, Take 6.25 mg by mouth 3 (three) times daily. , Disp: , Rfl:  .  CRESTOR 10 MG tablet, Take 10 mg by mouth daily., Disp: , Rfl: 3 .  eplerenone (INSPRA) 50 MG tablet, Take 50 mg by mouth daily., Disp: , Rfl:  .  famotidine (PEPCID) 20 MG tablet, Take by mouth., Disp: , Rfl:  .  ibuprofen (ADVIL,MOTRIN) 600 MG tablet, , Disp: , Rfl:  .  losartan-hydrochlorothiazide (HYZAAR) 100-25 MG per tablet, Take 1 tablet by mouth daily., Disp: , Rfl:  .  Multiple Vitamins-Minerals (MULTIVITAMIN ADULTS PO), Take by mouth., Disp: , Rfl:  .  Omega-3 Fatty Acids (FISH OIL PO), Take 350 mg by mouth daily., Disp: , Rfl:    Allergies:  Simvastatin  Review of Systems:  Constitutional: Feels well. Cardiovascular: Denies chest pain, exertional chest pain.  Pulmonary: Denies hemoptysis, pleuritic chest pain.   The remainder of systems were reviewed and were found to be negative other than what is documented in the HPI.    Physical Examination:   VS: BP (!) 140/92 (BP Location: Left Arm, Cuff Size: Normal)   Pulse 65   Temp (!) 97.3 F (36.3 C) (Temporal)   Ht 6\' 3"  (1.905 m)   Wt 281 lb 9.6 oz (127.7 kg)   SpO2 100%   BMI 35.20 kg/m   General Appearance: No distress  Neuro:without focal findings, mental status, speech normal, alert and oriented HEENT: PERRLA, EOM intact Pulmonary: No wheezing, No rales  CardiovascularNormal S1,S2.  No m/r/g.  Abdomen: Benign, Soft, non-tender, No masses Renal:  No costovertebral tenderness  GU:  No performed at this time. Endoc: No evident thyromegaly, no signs of acromegaly or Cushing features Skin:   warm, no rashes, no ecchymosis  Extremities: normal, no cyanosis, clubbing.    LABORATORY PANEL:   CBC No results for input(s): WBC, HGB, HCT, PLT in the last 168 hours. ------------------------------------------------------------------------------------------------------------------  Chemistries  No  results for input(s): NA, K, CL, CO2, GLUCOSE, BUN, CREATININE, CALCIUM, MG, AST, ALT, ALKPHOS, BILITOT in the last 168 hours.  Invalid input(s): GFRCGP ------------------------------------------------------------------------------------------------------------------  Cardiac Enzymes No results for input(s): TROPONINI in the last 168 hours. ------------------------------------------------------------  RADIOLOGY:  No results found.     Thank  you for the consultation and for allowing [facility] to assist in the care of your patient. Our recommendations are noted above.  Please contact us if we can be of further service.   Marda Stalker, M.D., F.C.C.P.  Board Certified in Internal Medicine, Pulmonary Medicine, [place], and Sleep Medicine.  [facility] Office Number: [phone]   10/01/2018

## 2018-10-04 ENCOUNTER — Ambulatory Visit (INDEPENDENT_AMBULATORY_CARE_PROVIDER_SITE_OTHER): Payer: Medicare Other | Admitting: Internal Medicine

## 2018-10-04 ENCOUNTER — Other Ambulatory Visit: Payer: Self-pay

## 2018-10-04 ENCOUNTER — Encounter: Payer: Self-pay | Admitting: Internal Medicine

## 2018-10-04 VITALS — BP 140/92 | HR 65 | Temp 97.3°F | Ht 75.0 in | Wt 281.6 lb

## 2018-10-04 DIAGNOSIS — G4733 Obstructive sleep apnea (adult) (pediatric): Secondary | ICD-10-CM

## 2018-10-04 DIAGNOSIS — Z9989 Dependence on other enabling machines and devices: Secondary | ICD-10-CM

## 2018-10-04 DIAGNOSIS — Z86010 Personal history of colonic polyps: Secondary | ICD-10-CM

## 2018-10-04 NOTE — Telephone Encounter (Signed)
Spoke with pt and was able to schedule colonoscopy procedure. Pt has been cleared for surgery by cardiology and pulmonology. Pt is aware he'll need to bring his CPAP.

## 2018-10-04 NOTE — Patient Instructions (Addendum)
You are cleared for surgery from respiratory standpoint.  If you would like a referral for a dental or implantable device evaluation please let us know. Other you can follow up with [place] as needed.

## 2018-10-14 ENCOUNTER — Telehealth: Payer: Self-pay | Admitting: Internal Medicine

## 2018-10-14 NOTE — Telephone Encounter (Signed)
Pt is calling back [phone]

## 2018-10-14 NOTE — Telephone Encounter (Signed)
Called and spoke to pt.  Pt wishes to resume cpap. Pt is requesting that pressure be decreased. Per dme order, it appears that current settings are auto 7-11cm.  DR please advise. Thanks

## 2018-10-14 NOTE — Telephone Encounter (Signed)
Pt is requesting an appt to come in and discuss cpap machine with Dr. Ashby Dawes. appt has been scheduled for 10/18/2018. Nothing further is needed.

## 2018-10-14 NOTE — Telephone Encounter (Signed)
Message routed to [place] triage. 

## 2018-10-15 ENCOUNTER — Other Ambulatory Visit
Admission: RE | Admit: 2018-10-15 | Discharge: 2018-10-15 | Disposition: A | Discharge status: Home / Self Care | Payer: Medicare Other | Source: Ambulatory Visit | Attending: Gastroenterology | Admitting: Gastroenterology

## 2018-10-15 ENCOUNTER — Telehealth: Payer: Self-pay | Admitting: Internal Medicine

## 2018-10-15 ENCOUNTER — Other Ambulatory Visit: Payer: Self-pay

## 2018-10-15 DIAGNOSIS — Z1159 Encounter for screening for other viral diseases: Secondary | ICD-10-CM | POA: Insufficient documentation

## 2018-10-15 NOTE — Progress Notes (Signed)
[facility] Consultation      Assessment and Plan:  Obstructive sleep apnea. --CPAP titration study which showed an ideal pressure 15, however has significant tolerance issues at various different pressures.  He would like to try one more time, therefore I have set his pressure at the lowest setting of 5 fixed without auto titration.  We will perform a download and follow-up in about 1 month to see if this is tolerated. --Discussed the options including mandibular advancement device versus hypoglossal nerve stimulation device.  He will consider an oral device for treatment of sleep apnea if he is intolerant of CPAP.  Coronary artery disease, TIA, GERD, essential hypertension. - Sleep apnea can contribute to above issues, therefore adequate control of sleep apnea is important for their management. Orders Placed This Encounter  Procedures  . AMB REFERRAL FOR DME     Return in about 67 month (around 11/17/2018) for download of CPAP. [name]    Date: 10/15/2018  MRN# [id number] Samuel Mason 06/18/1950    Samuel Mason is a 68 y.o. old male seen in consultation for chief complaint of:    No chief complaint on file.   HPI:  Samuel Mason is a 68 y.o. male with a history of moderate sleep apnea.  Patient has gone through numerous changes of auto CPAP settings due to intolerance of CPAP is detailed below.  -on a CPAP pressure of 10 but was causing some chest discomfort, therefore decreased to 8.   -He felt the pressure was too low at that time, therefore he was increased to a range of 10-16, -Felt that pressure too slightly high, changed subsequently to 9-15.   -Felt that pressure was too low, underwent a CPAP titration study.  On this the ideal pressure was 15, however highest tolerated pressure was 10, therefore recommended CPAP pressure range of 9-11. -Felt that pressure was too high, then changed to auto CPAP 5-10.   He is here because he feels that he has wanted to  try using it again and wanted to know if the pressure was lowered. He notes that he can tolerate it at first, but once he falls asleep then it wakes him up because it causes his chest to hurt.    **CPAP download 09/16/2018-10/15/2018>> raw data personally reviewed.  Usage greater than 4 hours is 0/30 days.  Usage on any days is 2/30 days.  Average usage on days used 48 minutes.  Set pressure is 5-10.  Median pressure 7, 95th percentile pressure 7.4, maximum pressure 7.4, leaks are within normal limits, residual AHI is 5.6. **CPAP titration study 12/17/2017>> patient's ideal pressure was 15, however patient's highest tolerated pressure was 10.  Recommended auto CPAP with pressure range 9-11. **Download data reviewed 11/01/2017-11/30/2017>> usage greater than 4 hours is 15/30 days.  Average usage on days used is 432 minutes.  Pressure ranges 9-15.  Median pressure 11, 95th percentile pressure 13, maximum pressure 14.  Residual AHI is elevated at 14.7.  This shows borderline compliance with adequate though not ideal control of obstructive sleep apnea. **Download data reviewed 10/03/2017-11/01/2017>> data personally reviewed, usage greater than 4 hours is 14/30 days.  Average usage on days used was 4 hours 57 minutes.  Pressure ranges 8-15.  Median pressure 11, 95th percentile pressure 13, action pressure 14.  Residual AHI 8.7, central apnea index of 3.5.   -This shows an inadequate compliance with CPAP with suboptimal control of obstructive sleep apnea. **HST 08/20/2017 AHI 27.  Started AutoPap 8-15. **Review of outside sleep study report from 08/30/2007.  This was a CPAP titration, patient required CPAP at a level of 10 with an AHI reduced to 0.  In the study the patient had a central apnea index of as high as 6.--  Medication:    Current Outpatient Medications:  .  amLODipine (NORVASC) 10 MG tablet, Take 10 mg by mouth daily. Take 1.5 tablet daily, Disp: , Rfl:  .  aspirin 81 MG tablet, Take 81 mg by mouth  daily., Disp: , Rfl:  .  carvedilol (COREG) 6.25 MG tablet, Take 6.25 mg by mouth 3 (three) times daily. , Disp: , Rfl:  .  CRESTOR 10 MG tablet, Take 10 mg by mouth daily., Disp: , Rfl: 3 .  eplerenone (INSPRA) 50 MG tablet, Take 50 mg by mouth daily., Disp: , Rfl:  .  famotidine (PEPCID) 20 MG tablet, Take by mouth., Disp: , Rfl:  .  ibuprofen (ADVIL,MOTRIN) 600 MG tablet, , Disp: , Rfl:  .  losartan-hydrochlorothiazide (HYZAAR) 100-25 MG per tablet, Take 1 tablet by mouth daily., Disp: , Rfl:  .  Multiple Vitamins-Minerals (MULTIVITAMIN ADULTS PO), Take by mouth., Disp: , Rfl:  .  Omega-3 Fatty Acids (FISH OIL PO), Take 350 mg by mouth daily., Disp: , Rfl:    Allergies:  Simvastatin    LABORATORY PANEL:   CBC No results for input(s): WBC, HGB, HCT, PLT in the last 168 hours. ------------------------------------------------------------------------------------------------------------------  Chemistries  No results for input(s): NA, K, CL, CO2, GLUCOSE, BUN, CREATININE, CALCIUM, MG, AST, ALT, ALKPHOS, BILITOT in the last 168 hours.  Invalid input(s): GFRCGP ------------------------------------------------------------------------------------------------------------------  Cardiac Enzymes No results for input(s): TROPONINI in the last 168 hours. ------------------------------------------------------------  RADIOLOGY:  No results found.     Thank  you for the consultation and for allowing [facility] to assist in the care of your patient. Our recommendations are noted above.  Please contact us if we can be of further service.   Marda Stalker, M.D., F.C.C.P.  Board Certified in Internal Medicine, Pulmonary Medicine, [place], and Sleep Medicine.  [facility] Office Number: [phone]   10/15/2018

## 2018-10-15 NOTE — Telephone Encounter (Signed)
Called patient for COVID-19 pre-screening for in office visit.  Have you recently traveled any where out of the local area in the last 2 weeks? No  Have you been in close contact with a person diagnosed with COVID-19 within the last 2 weeks? No  Do you currently have any of the following symptoms? If so, when did they start? Cough     Diarrhea   Joint Pain Fever      Muscle Pain   Red eyes Shortness of breath   Abdominal pain  Vomiting Loss of smell    Rash    Sore Throat Headache    Weakness   Bruising or bleeding   Okay to proceed with visit 10/18/2018

## 2018-10-16 LAB — NOVEL CORONAVIRUS, NAA (HOSP ORDER, SEND-OUT TO REF LAB; TAT 18-24 HRS): SARS-CoV-2, NAA: NOT DETECTED

## 2018-10-18 ENCOUNTER — Encounter: Payer: Self-pay | Admitting: Internal Medicine

## 2018-10-18 ENCOUNTER — Other Ambulatory Visit: Payer: Self-pay

## 2018-10-18 ENCOUNTER — Ambulatory Visit (INDEPENDENT_AMBULATORY_CARE_PROVIDER_SITE_OTHER): Payer: Medicare Other | Admitting: Internal Medicine

## 2018-10-18 VITALS — BP 140/80 | HR 56 | Temp 97.5°F | Ht 75.0 in | Wt 282.2 lb

## 2018-10-18 DIAGNOSIS — Z9989 Dependence on other enabling machines and devices: Secondary | ICD-10-CM | POA: Diagnosis not present

## 2018-10-18 DIAGNOSIS — G4733 Obstructive sleep apnea (adult) (pediatric): Secondary | ICD-10-CM | POA: Diagnosis not present

## 2018-10-18 NOTE — Patient Instructions (Signed)
Will decrease CPAP pressure to 5 cm H2O (set pressure).  Will need download in 1 month.

## 2018-10-19 ENCOUNTER — Ambulatory Visit: Payer: Medicare Other | Admitting: Anesthesiology

## 2018-10-19 ENCOUNTER — Encounter: Payer: Self-pay | Admitting: Gastroenterology

## 2018-10-19 ENCOUNTER — Other Ambulatory Visit: Payer: Self-pay

## 2018-10-19 ENCOUNTER — Ambulatory Visit
Admission: RE | Admit: 2018-10-19 | Discharge: 2018-10-19 | Disposition: A | Discharge status: Home / Self Care | Payer: Medicare Other | Attending: Gastroenterology | Admitting: Gastroenterology

## 2018-10-19 ENCOUNTER — Encounter
Admission: RE | Disposition: A | Discharge status: Home / Self Care | Payer: Self-pay | Source: Home / Self Care | Attending: Gastroenterology

## 2018-10-19 DIAGNOSIS — N183 Chronic kidney disease, stage 3 (moderate): Secondary | ICD-10-CM | POA: Diagnosis not present

## 2018-10-19 DIAGNOSIS — D123 Benign neoplasm of transverse colon: Secondary | ICD-10-CM

## 2018-10-19 DIAGNOSIS — Z86010 Personal history of colonic polyps: Secondary | ICD-10-CM | POA: Diagnosis not present

## 2018-10-19 DIAGNOSIS — Z79899 Other long term (current) drug therapy: Secondary | ICD-10-CM | POA: Insufficient documentation

## 2018-10-19 DIAGNOSIS — F419 Anxiety disorder, unspecified: Secondary | ICD-10-CM | POA: Insufficient documentation

## 2018-10-19 DIAGNOSIS — Z6835 Body mass index (BMI) 35.0-35.9, adult: Secondary | ICD-10-CM | POA: Insufficient documentation

## 2018-10-19 DIAGNOSIS — Z8249 Family history of ischemic heart disease and other diseases of the circulatory system: Secondary | ICD-10-CM | POA: Diagnosis not present

## 2018-10-19 DIAGNOSIS — I129 Hypertensive chronic kidney disease with stage 1 through stage 4 chronic kidney disease, or unspecified chronic kidney disease: Secondary | ICD-10-CM | POA: Diagnosis not present

## 2018-10-19 DIAGNOSIS — E785 Hyperlipidemia, unspecified: Secondary | ICD-10-CM | POA: Diagnosis not present

## 2018-10-19 DIAGNOSIS — Z1211 Encounter for screening for malignant neoplasm of colon: Secondary | ICD-10-CM | POA: Insufficient documentation

## 2018-10-19 DIAGNOSIS — I493 Ventricular premature depolarization: Secondary | ICD-10-CM | POA: Diagnosis not present

## 2018-10-19 DIAGNOSIS — G4733 Obstructive sleep apnea (adult) (pediatric): Secondary | ICD-10-CM | POA: Insufficient documentation

## 2018-10-19 DIAGNOSIS — Z888 Allergy status to other drugs, medicaments and biological substances status: Secondary | ICD-10-CM | POA: Diagnosis not present

## 2018-10-19 DIAGNOSIS — K219 Gastro-esophageal reflux disease without esophagitis: Secondary | ICD-10-CM | POA: Insufficient documentation

## 2018-10-19 DIAGNOSIS — Z7982 Long term (current) use of aspirin: Secondary | ICD-10-CM | POA: Diagnosis not present

## 2018-10-19 DIAGNOSIS — Z8673 Personal history of transient ischemic attack (TIA), and cerebral infarction without residual deficits: Secondary | ICD-10-CM | POA: Diagnosis not present

## 2018-10-19 DIAGNOSIS — I2510 Atherosclerotic heart disease of native coronary artery without angina pectoris: Secondary | ICD-10-CM | POA: Diagnosis not present

## 2018-10-19 DIAGNOSIS — Z791 Long term (current) use of non-steroidal anti-inflammatories (NSAID): Secondary | ICD-10-CM | POA: Diagnosis not present

## 2018-10-19 HISTORY — PX: COLONOSCOPY WITH PROPOFOL: SHX5780

## 2018-10-19 SURGERY — COLONOSCOPY WITH PROPOFOL
Anesthesia: General

## 2018-10-19 MED ORDER — PROPOFOL 500 MG/50ML IV EMUL
INTRAVENOUS | Status: DC | PRN
  Administered 2018-10-19: 175 ug/kg/min via INTRAVENOUS

## 2018-10-19 MED ORDER — PROPOFOL 10 MG/ML IV BOLUS
INTRAVENOUS | Status: DC | PRN
  Administered 2018-10-19: 80 mg via INTRAVENOUS
  Administered 2018-10-19: 20 mg via INTRAVENOUS

## 2018-10-19 MED ORDER — LIDOCAINE HCL (PF) 2 % IJ SOLN
INTRAMUSCULAR | Status: AC
  Filled 2018-10-19: qty 10

## 2018-10-19 MED ORDER — SODIUM CHLORIDE 0.9 % IV SOLN
INTRAVENOUS | Status: DC
  Administered 2018-10-19: 09:00:00 via INTRAVENOUS

## 2018-10-19 MED ORDER — PROPOFOL 10 MG/ML IV BOLUS
INTRAVENOUS | Status: AC
  Filled 2018-10-19: qty 40

## 2018-10-19 NOTE — Op Note (Signed)
[facility] Gastroenterology Patient Name: Samuel Mason Procedure Date: 10/19/2018 9:08 AM MRN: [id number] Account #: [id number] Date of Birth: May 12, 1950 Admit Type: Outpatient Age: 68 Room: [facility] ENDO ROOM 1 Gender: Male Note Status: Finalized Procedure:            Colonoscopy Indications:          High risk colon cancer surveillance: Personal history                        of colonic polyps Providers:            Jonathon Bellows MD, MD Referring MD:         [name], MD (Referring MD) Medicines:            Monitored Anesthesia Care Complications:        No immediate complications. Procedure:            Pre-Anesthesia Assessment:                       - Prior to the procedure, a History and Physical was                        performed, and patient medications, allergies and                        sensitivities were reviewed. The patient's tolerance of                        previous anesthesia was reviewed.                       - The risks and benefits of the procedure and the                        sedation options and risks were discussed with the                        patient. All questions were answered and informed                        consent was obtained.                       - ASA Grade Assessment: II - A patient with mild                        systemic disease.                       After obtaining informed consent, the colonoscope was                        passed under direct vision. Throughout the procedure,                        the patient's blood pressure, pulse, and oxygen                        saturations were monitored continuously. The                        Colonoscope  was introduced through the anus and                        advanced to the the cecum, identified by the                        appendiceal orifice, IC valve and transillumination.                        The colonoscopy was performed with ease. The patient            tolerated the procedure well. The quality of the bowel                        preparation was adequate. Findings:      The perianal and digital rectal examinations were normal.      A 5 mm polyp was found in the transverse colon. The polyp was sessile.       The polyp was removed with a cold snare. Resection and retrieval were       complete.      The exam was otherwise without abnormality on direct and retroflexion       views. Impression:           - One 5 mm polyp in the transverse colon, removed with                        a cold snare. Resected and retrieved.                       - The examination was otherwise normal on direct and                        retroflexion views. Recommendation:       - Discharge patient to home (with escort).                       - Resume previous diet.                       - Continue present medications.                       - Await pathology results.                       - Repeat colonoscopy in 5 years for surveillance. Procedure Code(s):    --- Professional ---                       [id number], Colonoscopy, flexible; with removal of tumor(s),                        polyp(s), or other lesion(s) by snare technique Diagnosis Code(s):    --- Professional ---                       Z86.010, Personal history of colonic polyps                       K63.5, Polyp of colon CPT copyright 2019 American Medical Association. All rights reserved. The codes documented in this report are preliminary and upon  coder review may  be revised to meet current compliance requirements. Jonathon Bellows, MD Jonathon Bellows MD, MD 10/19/2018 9:28:35 AM This report has been signed electronically. Number of Addenda: 0 Note Initiated On: 10/19/2018 9:08 AM Scope Withdrawal Time: 0 hours 10 minutes 4 seconds  Total Procedure Duration: 0 hours 11 minutes 47 seconds  Estimated Blood Loss: Estimated blood loss: none.      [facility]

## 2018-10-19 NOTE — Anesthesia Preprocedure Evaluation (Signed)
Anesthesia Evaluation  Patient identified by MRN, date of birth, ID band Patient awake    Reviewed: Allergy & Precautions, H&P , NPO status , Patient's Chart, lab work & pertinent test results, reviewed documented beta blocker date and time   Airway Mallampati: II   Neck ROM: full    Dental  (+) Poor Dentition   Pulmonary sleep apnea ,    Pulmonary exam normal        Cardiovascular Exercise Tolerance: Poor hypertension, On Medications + CAD  Normal cardiovascular exam Rhythm:regular Rate:Normal     Neuro/Psych PSYCHIATRIC DISORDERS Anxiety TIACVA    GI/Hepatic Neg liver ROS, GERD  ,  Endo/Other  negative endocrine ROS  Renal/GU Renal disease  negative genitourinary   Musculoskeletal   Abdominal   Peds  Hematology negative hematology ROS (+)   Anesthesia Other Findings Past Medical History: No date: Asymptomatic PVCs     Comment:  a. 07/2012 Holter: 7965 PVCs in 48 hrs; b. 07/2015 24hr               Holter: 6000 PVCs (7%). No date: CAD (coronary artery disease)     Comment:  a. 2009 Cath: nonobs dzs; b. 02/2010 Cath: LAD 44m, D1               90, LCX 8m, RCA min irregs, EF 60%-->Med Rx; c. 07/2012               Neg ETT; d. 05/2015 Neg MV. EF 73%. No date: Carotid arterial disease ([place])     Comment:  a. 11/2016 Carotid u/s: mild bilat carotid plaques. No date: CKD (chronic kidney disease), stage III (HCC)     Comment:  a. Creat 1.67 05/22/2017. No date: GERD (gastroesophageal reflux disease)     Comment:  a. noncompliant w/ PPI. No date: History of echocardiogram     Comment:  a. 09/2016 Echo: EF 60-65%, no orwma, mild MR, mildly dil              LA/RA. No date: Hyperlipidemia No date: Hypertension No date: Morbid obesity ([place]) No date: Obstructive sleep apnea     Comment:  a. noncompliant w/ CPAP. No date: TIA (transient ischemic attack) Past Surgical History: 2011: CARDIAC CATHETERIZATION     Comment:  50%  mid LAD stenosis, 40% proximal LCX, EF 60% 2009: CARDIAC CATHETERIZATION     Comment:  Mild CAD             Reproductive/Obstetrics negative OB ROS                             Anesthesia Physical Anesthesia Plan  ASA: III  Anesthesia Plan: General   Post-op Pain Management:    Induction:   PONV Risk Score and Plan:   Airway Management Planned:   Additional Equipment:   Intra-op Plan:   Post-operative Plan:   Informed Consent: I have reviewed the patients History and Physical, chart, labs and discussed the procedure including the risks, benefits and alternatives for the proposed anesthesia with the patient or authorized representative who has indicated his/her understanding and acceptance.     Dental Advisory Given  Plan Discussed with: CRNA  Anesthesia Plan Comments:         Anesthesia Quick Evaluation

## 2018-10-19 NOTE — Anesthesia Post-op Follow-up Note (Signed)
Anesthesia QCDR form completed.        

## 2018-10-19 NOTE — H&P (Signed)
Jonathon Bellows, MD [address], [place], [place], [place], [address] [address], [place], [place], [place], [address] Phone: [phone]  Fax: [phone]  Primary Care Physician:  [name], MD   Pre-Procedure History & Physical: HPI:  Samuel Mason is a 68 y.o. male is here for an colonoscopy.   Past Medical History:  Diagnosis Date  . Asymptomatic PVCs    a. 07/2012 Holter: 7965 PVCs in 48 hrs; b. 07/2015 24hr Holter: 6000 PVCs (7%).  [name] CAD (coronary artery disease)    a. 2009 Cath: nonobs dzs; b. 02/2010 Cath: LAD 30m, D1 90, LCX 31m, RCA min irregs, EF 60%-->Med Rx; c. 07/2012 Neg ETT; d. 05/2015 Neg MV. EF 73%.  . Carotid arterial disease ([place])    a. 11/2016 Carotid u/s: mild bilat carotid plaques.  . CKD (chronic kidney disease), stage III ([place])    a. Creat 1.67 05/22/2017.  [name] GERD (gastroesophageal reflux disease)    a. noncompliant w/ PPI.  [name] History of echocardiogram    a. 09/2016 Echo: EF 60-65%, no orwma, mild MR, mildly dil LA/RA.  [name] Hyperlipidemia   . Hypertension   . Morbid obesity ([place])   . Obstructive sleep apnea    a. noncompliant w/ CPAP.  [name] TIA (transient ischemic attack)     Past Surgical History:  Procedure Laterality Date  . CARDIAC CATHETERIZATION  2011   50% mid LAD stenosis, 40% proximal LCX, EF 60%  . CARDIAC CATHETERIZATION  2009   Mild CAD              Prior to Admission medications   Medication Sig Start Date End Date Taking? Authorizing Provider  amLODipine (NORVASC) 10 MG tablet Take 10 mg by mouth daily. Take 1.5 tablet daily   Yes [provider]  aspirin 81 MG tablet Take 81 mg by mouth daily.   Yes [provider]  carvedilol (COREG) 6.25 MG tablet Take 6.25 mg by mouth 3 (three) times daily.    Yes [provider]  CRESTOR 10 MG tablet Take 10 mg by mouth daily. 06/27/15  Yes [provider]  eplerenone (INSPRA) 50 MG tablet Take 50 mg by mouth daily.   Yes [provider]   famotidine (PEPCID) 20 MG tablet Take by mouth. 06/14/18 06/14/19 Yes [provider]  ibuprofen (ADVIL,MOTRIN) 600 MG tablet  04/12/18  Yes [provider]  losartan-hydrochlorothiazide (HYZAAR) 100-25 MG per tablet Take 1 tablet by mouth daily.   Yes [provider]  Multiple Vitamins-Minerals (MULTIVITAMIN ADULTS PO) Take by mouth.   Yes [provider]  Omega-3 Fatty Acids (FISH OIL PO) Take 350 mg by mouth daily.   Yes [provider]    Allergies as of 10/04/2018 - Review Complete 10/04/2018  Allergen Reaction Noted  . Simvastatin      Family History  Problem Relation Age of Onset  . Heart attack Father        MI    Social History   Socioeconomic History  . Marital status: Married    Spouse name: Not on file  . Number of children: Not on file  . Years of education: Not on file  . Highest education level: Not on file  Occupational History  . Not on file  Social Needs  . Financial resource strain: Not on file  . Food insecurity    Worry: Not on file    Inability: Not on file  . Transportation needs  Medical: Not on file    Non-medical: Not on file  Tobacco Use  . Smoking status: Never Smoker  . Smokeless tobacco: Never Used  Substance and Sexual Activity  . Alcohol use: No    Alcohol/week: 0.0 standard drinks    Comment: socail drinker  . Drug use: No  . Sexual activity: Not on file  Lifestyle  . Physical activity    Days per week: Not on file    Minutes per session: Not on file  . Stress: Not on file  Relationships  . Social Herbalist on phone: Not on file    Gets together: Not on file    Attends religious service: Not on file    Active member of club or organization: Not on file    Attends meetings of clubs or organizations: Not on file    Relationship status: Not on file  . Intimate partner violence    Fear of current or ex partner: Not on file    Emotionally abused: Not on file    Physically  abused: Not on file    Forced sexual activity: Not on file  Other Topics Concern  . Not on file  Social History Narrative  . Not on file    Review of Systems: See HPI, otherwise negative ROS  Physical Exam: BP (!) 161/92   Pulse 98   Temp 97.9 F (36.6 C) (Tympanic)   Resp 20   Ht 6\' 3"  (1.905 m)   Wt 127 kg   SpO2 100%   BMI 35.00 kg/m  General:   Alert,  pleasant and cooperative in NAD Head:  Normocephalic and atraumatic. Neck:  Supple; no masses or thyromegaly. Lungs:  Clear throughout to auscultation, normal respiratory effort.    Heart:  +S1, +S2, Regular rate and rhythm, No edema. Abdomen:  Soft, nontender and nondistended. Normal bowel sounds, without guarding, and without rebound.   Neurologic:  Alert and  oriented x4;  grossly normal neurologically.  Impression/Plan: Samuel Mason is here for an colonoscopy to be performed for surveillance due to prior history of colon polyps   Risks, benefits, limitations, and alternatives regarding  colonoscopy have been reviewed with the patient.  Questions have been answered.  All parties agreeable.   Jonathon Bellows, MD  10/19/2018, 9:04 AM

## 2018-10-19 NOTE — Transfer of Care (Signed)
Immediate Anesthesia Transfer of Care Note  Patient: Samuel Mason  Procedure(s) Performed: COLONOSCOPY WITH PROPOFOL (N/A )  Patient Location: PACU  Anesthesia Type:General  Level of Consciousness: drowsy  Airway & Oxygen Therapy: Patient Spontanous Breathing and Patient connected to nasal cannula oxygen  Post-op Assessment: Report given to RN and Post -op Vital signs reviewed and stable  Post vital signs: Reviewed and stable  Last Vitals:  Vitals Value Taken Time  BP    Temp    Pulse    Resp    SpO2      Last Pain:  Vitals:   10/19/18 0830  TempSrc: Tympanic  PainSc: 0-No pain         Complications: No apparent anesthesia complications

## 2018-10-20 LAB — SURGICAL PATHOLOGY

## 2018-10-20 NOTE — Anesthesia Postprocedure Evaluation (Signed)
Anesthesia Post Note  Patient: Samuel Mason  Procedure(s) Performed: COLONOSCOPY WITH PROPOFOL (N/A )  Patient location during evaluation: PACU Anesthesia Type: General Level of consciousness: awake and alert Pain management: pain level controlled Vital Signs Assessment: post-procedure vital signs reviewed and stable Respiratory status: spontaneous breathing, nonlabored ventilation, respiratory function stable and patient connected to nasal cannula oxygen Cardiovascular status: blood pressure returned to baseline and stable Postop Assessment: no apparent nausea or vomiting Anesthetic complications: no     Last Vitals:  Vitals:   10/19/18 0954 10/19/18 1004  BP: 123/68 123/87  Pulse: (!) 47 (!) 56  Resp: 17 19  Temp:    SpO2: 99% 95%    Last Pain:  Vitals:   10/20/18 0731  TempSrc:   PainSc: 0-No pain                 Molli Barrows

## 2018-10-25 ENCOUNTER — Encounter: Payer: Self-pay | Admitting: Gastroenterology

## 2018-11-17 ENCOUNTER — Ambulatory Visit: Payer: Medicare Other | Admitting: Internal Medicine

## 2018-12-09 ENCOUNTER — Ambulatory Visit: Payer: Medicare Other | Admitting: Podiatry

## 2019-03-24 ENCOUNTER — Other Ambulatory Visit: Payer: Self-pay

## 2019-03-24 ENCOUNTER — Ambulatory Visit (INDEPENDENT_AMBULATORY_CARE_PROVIDER_SITE_OTHER): Payer: Medicare Other | Admitting: Podiatry

## 2019-03-24 ENCOUNTER — Encounter: Payer: Self-pay | Admitting: Podiatry

## 2019-03-24 DIAGNOSIS — B351 Tinea unguium: Secondary | ICD-10-CM

## 2019-03-24 DIAGNOSIS — M79675 Pain in left toe(s): Secondary | ICD-10-CM | POA: Diagnosis not present

## 2019-03-24 DIAGNOSIS — M79674 Pain in right toe(s): Secondary | ICD-10-CM | POA: Diagnosis not present

## 2019-03-24 NOTE — Progress Notes (Signed)
Complaint:  Visit Type: Patient returns to my office for continued preventative foot care services. Complaint: Patient states" my nails have grown long and thick and become painful to walk and wear shoes"  The patient presents for preventative foot care services. No changes to ROS  Podiatric Exam: Vascular: dorsalis pedis and posterior tibial pulses are palpable bilateral. Capillary return is immediate. Temperature gradient is WNL. Skin turgor WNL  Sensorium: Normal Semmes Weinstein monofilament test. Normal tactile sensation bilaterally. Nail Exam: Pt has thick disfigured discolored nails with subungual debris noted bilateral entire nail hallux through fifth toenails Ulcer Exam: There is no evidence of ulcer or pre-ulcerative changes or infection. Orthopedic Exam: Muscle tone and strength are WNL. No limitations in general ROM. No crepitus or effusions noted. Foot type and digits show no abnormalities. Bony prominences are unremarkable. Skin: No Porokeratosis. No infection or ulcers  Diagnosis:  Onychomycosis, , Pain in right toe, pain in left toes  Treatment & Plan Procedures and Treatment: Consent by patient was obtained for treatment procedures.   Debridement of mycotic and hypertrophic toenails, 1 through 5 bilateral and clearing of subungual debris. No ulceration, no infection noted.  Return Visit-Office Procedure: Patient instructed to return to the office for a follow up visit 4 months for continued evaluation and treatment.    Mahdi Frye DPM 

## 2019-06-23 ENCOUNTER — Ambulatory Visit: Payer: Medicare Other | Attending: Internal Medicine

## 2019-06-23 DIAGNOSIS — Z23 Encounter for immunization: Secondary | ICD-10-CM | POA: Insufficient documentation

## 2019-06-23 NOTE — Progress Notes (Signed)
   Covid-19 Vaccination Clinic  Name:  Samuel Mason    MRN: [id number] DOB: [birth year]  06/23/2019  Mr. Landino was observed post Covid-19 immunization for 15 minutes without incident. He was provided with Vaccine Information Sheet and instruction to access the V-Safe system.   Mr. Staiger was instructed to call 911 with any severe reactions post vaccine: [name] Difficulty breathing  . Swelling of face and throat  . A fast heartbeat  . A bad rash all over body  . Dizziness and weakness   Immunizations Administered    Name Date Dose VIS Date Route   Pfizer COVID-19 Vaccine 06/23/2019  9:21 AM 0.3 mL 04/01/2019 Intramuscular   Manufacturer: [place]   Lot: [id number]   [place]: [id number]

## 2019-07-18 ENCOUNTER — Ambulatory Visit: Payer: Medicare Other | Attending: Internal Medicine

## 2019-07-18 DIAGNOSIS — Z23 Encounter for immunization: Secondary | ICD-10-CM

## 2019-07-18 NOTE — Progress Notes (Signed)
   Covid-19 Vaccination Clinic  Name:  Samuel Mason    MRN: [id number] DOB: June 04, 1950  07/18/2019  [name] was observed post Covid-19 immunization for 15 minutes without incident. He was provided with Vaccine Information Sheet and instruction to access the V-Safe system.   [name] was instructed to call 911 with any severe reactions post vaccine: [name] Difficulty breathing  . Swelling of face and throat  . A fast heartbeat  . A bad rash all over body  . Dizziness and weakness   Immunizations Administered    Name Date Dose VIS Date Route   Pfizer COVID-19 Vaccine 07/18/2019 10:35 AM 0.3 mL 04/01/2019 Intramuscular   Manufacturer: [place]   Lot: [id number]   [place]: [id number]

## 2019-07-21 ENCOUNTER — Ambulatory Visit: Payer: Medicare Other | Admitting: Podiatry

## 2019-09-05 ENCOUNTER — Ambulatory Visit: Payer: Medicare Other | Admitting: Podiatry

## 2019-10-20 ENCOUNTER — Ambulatory Visit (INDEPENDENT_AMBULATORY_CARE_PROVIDER_SITE_OTHER): Payer: Medicare Other | Admitting: Vascular Surgery

## 2019-10-20 ENCOUNTER — Encounter: Payer: Self-pay | Admitting: Cardiovascular Disease

## 2019-10-20 ENCOUNTER — Ambulatory Visit (INDEPENDENT_AMBULATORY_CARE_PROVIDER_SITE_OTHER): Payer: Medicare Other | Admitting: Cardiovascular Disease

## 2019-10-20 ENCOUNTER — Encounter (INDEPENDENT_AMBULATORY_CARE_PROVIDER_SITE_OTHER): Payer: Self-pay | Admitting: Vascular Surgery

## 2019-10-20 ENCOUNTER — Other Ambulatory Visit: Payer: Self-pay

## 2019-10-20 VITALS — BP 122/72 | HR 69 | Ht 75.0 in | Wt 281.0 lb

## 2019-10-20 VITALS — BP 124/81 | HR 61 | Resp 16 | Ht 75.0 in | Wt 282.0 lb

## 2019-10-20 DIAGNOSIS — M79605 Pain in left leg: Secondary | ICD-10-CM | POA: Diagnosis not present

## 2019-10-20 DIAGNOSIS — E785 Hyperlipidemia, unspecified: Secondary | ICD-10-CM

## 2019-10-20 DIAGNOSIS — N183 Chronic kidney disease, stage 3 unspecified: Secondary | ICD-10-CM | POA: Diagnosis not present

## 2019-10-20 DIAGNOSIS — I2510 Atherosclerotic heart disease of native coronary artery without angina pectoris: Secondary | ICD-10-CM | POA: Diagnosis not present

## 2019-10-20 DIAGNOSIS — M79604 Pain in right leg: Secondary | ICD-10-CM

## 2019-10-20 DIAGNOSIS — I739 Peripheral vascular disease, unspecified: Secondary | ICD-10-CM | POA: Diagnosis not present

## 2019-10-20 DIAGNOSIS — I10 Essential (primary) hypertension: Secondary | ICD-10-CM

## 2019-10-20 DIAGNOSIS — I493 Ventricular premature depolarization: Secondary | ICD-10-CM | POA: Diagnosis not present

## 2019-10-20 NOTE — Patient Instructions (Signed)

## 2019-10-20 NOTE — Progress Notes (Signed)
Cardiology Office Note   Date:  10/20/2019   ID:  Samuel Mason, DOB 25-Jun-1950, MRN [id number]  PCP:  [name], MD  Cardiologist:   [name], MD   Chief Complaint  Patient presents with  . Other    12 month follow up. Patient c/o leg pain when standing. Meds reviewed verbally with patient.       History of Present Illness: Samuel Mason is a 69 y.o. male who presents for a followup visit regarding hypertension and frequent asymptomatic PVCs.   He is known to have refractory hypertension with no evidence of renal artery stenosis. He also suffers from chronic chest pain likely musculoskeletal with cardiac catheterization done twice in 2009 and 2011. Both times there was mild to moderate CAD without evidence of obstructive disease. He also has known history of sleep apnea does not tolerate CPAP well. He was first noted to have frequent PVCs years ago during his sleep study and has been treated medically with a beta blocker. Echocardiogram 07/2012  showed normal LV systolic function with only mild left ventricular hypertrophy. 4 Most recent treadmill nuclear stress test in February 2019 showed no  evidence of ischemia although he did have frequent PVCs during exercise and in recovery.  He was seen by Dr. Caryl Comes for frequent PVCs.  Carvedilol was increased with subsequent improvement.  He has been doing well with no recent chest pain, shortness of breath or palpitations.  He continues to have PVCs but does not seem to be symptomatic. He complains of left-sided lower back pain radiating to his thigh and calf.  This can happen at rest or with exertion.  Past Medical History:  Diagnosis Date  . Asymptomatic PVCs    a. 07/2012 Holter: 7965 PVCs in 48 hrs; b. 07/2015 24hr Holter: 6000 PVCs (7%).  [name] CAD (coronary artery disease)    a. 2009 Cath: nonobs dzs; b. 02/2010 Cath: LAD 24m, D1 90, LCX 54m, RCA min irregs, EF 60%-->Med Rx; c. 07/2012 Neg ETT; d. 05/2015 Neg MV. EF 73%.  .  Carotid arterial disease ([place])    a. 11/2016 Carotid u/s: mild bilat carotid plaques.  . CKD (chronic kidney disease), stage III    a. Creat 1.67 05/22/2017.  [name] GERD (gastroesophageal reflux disease)    a. noncompliant w/ PPI.  [name] History of echocardiogram    a. 09/2016 Echo: EF 60-65%, no orwma, mild MR, mildly dil LA/RA.  [name] Hyperlipidemia   . Hypertension   . Morbid obesity ([place])   . Obstructive sleep apnea    a. noncompliant w/ CPAP.  [name] TIA (transient ischemic attack)     Past Surgical History:  Procedure Laterality Date  . CARDIAC CATHETERIZATION  2011   50% mid LAD stenosis, 40% proximal LCX, EF 60%  . CARDIAC CATHETERIZATION  2009   Mild CAD            . COLONOSCOPY WITH PROPOFOL N/A 10/19/2018   Procedure: COLONOSCOPY WITH PROPOFOL;  Surgeon: Jonathon Bellows, MD;  Location: [facility] ENDOSCOPY;  Service: Gastroenterology;  Laterality: N/A;     Current Outpatient Medications  Medication Sig Dispense Refill  . amLODipine (NORVASC) 10 MG tablet Take 1.5 tablet daily    . aspirin 81 MG tablet Take 81 mg by mouth daily.    . carvedilol (COREG) 6.25 MG tablet Take 6.25 mg by mouth 3 (three) times daily.     . CRESTOR 10 MG tablet Take 10 mg by mouth daily.  3  .  eplerenone (INSPRA) 50 MG tablet Take 50 mg by mouth daily.    . famotidine (PEPCID) 20 MG tablet Take by mouth.    . losartan-hydrochlorothiazide (HYZAAR) 100-25 MG per tablet Take 1 tablet by mouth daily.    . Multiple Vitamins-Minerals (MULTIVITAMIN ADULTS PO) Take by mouth.    . Omega-3 Fatty Acids (FISH OIL PO) Take 350 mg by mouth daily.     No current facility-administered medications for this visit.    Allergies:   Simvastatin    Social History:  The patient  reports that he has never smoked. He has never used smokeless tobacco. He reports that he does not drink alcohol and does not use drugs.   Family History:  The patient's family history includes Heart attack in his father.    ROS:  Please see the history of  present illness.   Otherwise, review of systems are positive for none.   All other systems are reviewed and negative.    PHYSICAL EXAM: VS:  BP 122/72 (BP Location: Left Arm, Patient Position: Sitting, Cuff Size: Normal)   Pulse 69   Ht 6\' 3"  (1.905 m)   Wt 281 lb (127.5 kg)   SpO2 98%   BMI 35.12 kg/m  , BMI Body mass index is 35.12 kg/m. GEN: Well nourished, well developed, in no acute distress  HEENT: normal  Neck: no JVD, carotid bruits, or masses Cardiac: RRR with premature beats; no murmurs, rubs, or gallops,no edema  Respiratory:  clear to auscultation bilaterally, normal work of breathing GI: soft, nontender, nondistended, + BS MS: no deformity or atrophy  Skin: warm and dry, no rash Neuro:  Strength and sensation are intact Psych: euthymic mood, full affect Vascular: Femoral pulses normal bilaterally.  Posterior tibial is palpable.   EKG:  EKG is ordered today. The ekg ordered today demonstrates sinus rhythm with first-degree AV block and frequent PVCs.  Recent Labs: No results found for requested labs within last 8760 hours.    Lipid Panel    Component Value Date/Time   CHOL 234 (HH) 06/22/2006 1553   TRIG 91 06/22/2006 1553   HDL 36.4 (L) 06/22/2006 1553   CHOLHDL 6.4 CALC 06/22/2006 1553   VLDL 18 06/22/2006 1553   LDLDIRECT 183.4 06/22/2006 1553      Wt Readings from Last 3 Encounters:  10/20/19 281 lb (127.5 kg)  10/19/18 280 lb (127 kg)  10/18/18 282 lb 3.2 oz (128 kg)         ASSESSMENT AND PLAN:  1. Asymptomatic PVCs: Treated with carvedilol.  He is asymptomatic and has normal LV systolic function.  2. Refractory hypertension: Blood pressure is well controlled on current medications.   3. Hyperlipidemia: Continue treatment with rosuvastatin with a target LDL of less than 70. This is followed by Dr. Rosario Jacks.   4.  Left-sided back and thigh pain: Does not seem to be vascular in etiology.  His femoral pulse seems to be normal and distal  pulses are palpable.    Disposition:   FU with me in 1 year  Signed,  [name], MD  10/20/2019 11:55 AM    [place]

## 2019-10-21 ENCOUNTER — Encounter (INDEPENDENT_AMBULATORY_CARE_PROVIDER_SITE_OTHER): Payer: Self-pay | Admitting: Vascular Surgery

## 2019-10-21 DIAGNOSIS — I739 Peripheral vascular disease, unspecified: Secondary | ICD-10-CM | POA: Insufficient documentation

## 2019-10-21 DIAGNOSIS — M79606 Pain in leg, unspecified: Secondary | ICD-10-CM | POA: Insufficient documentation

## 2019-10-21 NOTE — Progress Notes (Signed)
MRN : [id number]  Samuel Mason is a 69 y.o. ([birth year]) male who presents with chief complaint of  Chief Complaint  Patient presents with  . New Patient (Initial Visit)    ref Rosario Jacks claudication  .  History of Present Illness:   The patient is seen for evaluation of painful lower extremities. Patient notes the pain is variable and not always associated with activity.  The pain is somewhat consistent day to day occurring on most days. The patient notes the pain also occurs with standing and routinely seems worse as the day wears on. The pain has been progressive over the past several years. The patient states these symptoms are causing  a profound negative impact on quality of life and daily activities.  The patient denies rest pain or dangling of an extremity off the side of the bed during the night for relief. No open wounds or sores at this time. No history of DVT or phlebitis. No prior interventions or surgeries.  There is a  history of back problems and DJD of the lumbar and sacral spine.    Current Meds  Medication Sig  . amLODipine (NORVASC) 10 MG tablet Take 1.5 tablet daily  . aspirin 81 MG tablet Take 81 mg by mouth daily.  . carvedilol (COREG) 6.25 MG tablet Take 6.25 mg by mouth 3 (three) times daily.   . CRESTOR 10 MG tablet Take 10 mg by mouth daily.  [name] eplerenone (INSPRA) 50 MG tablet Take 50 mg by mouth daily.  . famotidine (PEPCID) 20 MG tablet Take by mouth.  . losartan-hydrochlorothiazide (HYZAAR) 100-25 MG per tablet Take 1 tablet by mouth daily.  . Multiple Vitamins-Minerals (MULTIVITAMIN ADULTS PO) Take by mouth.  . Omega-3 Fatty Acids (FISH OIL PO) Take 350 mg by mouth daily.    Past Medical History:  Diagnosis Date  . Asymptomatic PVCs    a. 07/2012 Holter: 7965 PVCs in 48 hrs; b. 07/2015 24hr Holter: 6000 PVCs (7%).  [name] CAD (coronary artery disease)    a. 2009 Cath: nonobs dzs; b. 02/2010 Cath: LAD 66m, D1 90, LCX 41m, RCA min irregs, EF 60%-->Med  Rx; c. 07/2012 Neg ETT; d. 05/2015 Neg MV. EF 73%.  . Carotid arterial disease ([place])    a. 11/2016 Carotid u/s: mild bilat carotid plaques.  . CKD (chronic kidney disease), stage III    a. Creat 1.67 05/22/2017.  [name] GERD (gastroesophageal reflux disease)    a. noncompliant w/ PPI.  [name] History of echocardiogram    a. 09/2016 Echo: EF 60-65%, no orwma, mild MR, mildly dil LA/RA.  [name] Hyperlipidemia   . Hypertension   . Morbid obesity ([place])   . Obstructive sleep apnea    a. noncompliant w/ CPAP.  [name] TIA (transient ischemic attack)     Past Surgical History:  Procedure Laterality Date  . CARDIAC CATHETERIZATION  2011   50% mid LAD stenosis, 40% proximal LCX, EF 60%  . CARDIAC CATHETERIZATION  2009   Mild CAD            . COLONOSCOPY WITH PROPOFOL N/A 10/19/2018   Procedure: COLONOSCOPY WITH PROPOFOL;  Surgeon: Jonathon Bellows, MD;  Location: [facility] ENDOSCOPY;  Service: Gastroenterology;  Laterality: N/A;    Social History Social History   Tobacco Use  . Smoking status: Never Smoker  . Smokeless tobacco: Never Used  Vaping Use  . Vaping Use: Never used  Substance Use Topics  . Alcohol use: No    Alcohol/week: 0.0  standard drinks    Comment: socail drinker  . Drug use: No    Family History Family History  Problem Relation Age of Onset  . Heart attack Father        MI  No family history of bleeding/clotting disorders, porphyria or autoimmune disease   Allergies  Allergen Reactions  . Simvastatin     REACTION: Nausea     REVIEW OF SYSTEMS (Negative unless checked)  Constitutional: [] Weight loss  [] Fever  [] Chills Cardiac: [] Chest pain   [] Chest pressure   [] Palpitations   [] Shortness of breath when laying flat   [] Shortness of breath with exertion. Vascular:  [x] Pain in legs with walking   [x] Pain in legs at rest  [] History of DVT   [] Phlebitis   [] Swelling in legs   [] Varicose veins   [] Non-healing ulcers Pulmonary:   [] Uses home oxygen   [] Productive cough   [] Hemoptysis    [] Wheeze  [] COPD   [] Asthma Neurologic:  [] Dizziness   [] Seizures   [] History of stroke   [] History of TIA  [] Aphasia   [] Vissual changes   [] Weakness or numbness in arm   [] Weakness or numbness in leg Musculoskeletal:   [] Joint swelling   [] Joint pain   [] Low back pain Hematologic:  [] Easy bruising  [] Easy bleeding   [] Hypercoagulable state   [] Anemic Gastrointestinal:  [] Diarrhea   [] Vomiting  [] Gastroesophageal reflux/heartburn   [] Difficulty swallowing. Genitourinary:  [] Chronic kidney disease   [] Difficult urination  [] Frequent urination   [] Blood in urine Skin:  [] Rashes   [] Ulcers  Psychological:  [] History of anxiety   []  History of major depression.  Physical Examination  Vitals:   10/20/19 1401  BP: 124/81  Pulse: 61  Resp: 16  Weight: 282 lb (127.9 kg)  Height: 6\' 3"  (1.905 m)   Body mass index is 35.25 kg/m. Gen: WD/WN, NAD Head: [place]/AT, No temporalis wasting.  Ear/Nose/Throat: Hearing grossly intact, nares w/o erythema or drainage, poor dentition Eyes: PER, EOMI, sclera nonicteric.  Neck: Supple, no masses.  No bruit or JVD.  Pulmonary:  Good air movement, clear to auscultation bilaterally, no use of accessory muscles.  Cardiac: RRR, normal S1, S2, no Murmurs. Vascular: scattered varicosities present bilaterally.  Mild venous stasis changes to the legs bilaterally.  1+ soft pitting edema Vessel Right Left  Radial Palpable Palpable  PT Trace Palpable Trace Palpable  DP Trace Palpable Trace Palpable  Gastrointestinal: soft, non-distended. No guarding/no peritoneal signs.  Musculoskeletal: M/S 5/5 throughout.  No deformity or atrophy.  Neurologic: CN 2-12 intact. Pain and light touch intact in extremities.  Symmetrical.  Speech is fluent. Motor exam as listed above. Psychiatric: Judgment intact, Mood & affect appropriate for pt's clinical situation. Dermatologic: No rashes or ulcers noted.  No changes consistent with cellulitis.  CBC No results found for: WBC, HGB,  HCT, MCV, PLT  BMET    Component Value Date/Time   NA 137 07/28/2017 1653   K 3.6 07/28/2017 1653   CL 105 07/28/2017 1653   CO2 24 07/28/2017 1653   GLUCOSE 93 07/28/2017 1653   BUN 25 (H) 07/28/2017 1653   CREATININE 1.86 (H) 07/28/2017 1653   CALCIUM 9.0 07/28/2017 1653   GFRNONAA 36 (L) 07/28/2017 1653   GFRAA 42 (L) 07/28/2017 1653   CrCl cannot be calculated (Patient's most recent lab result is older than the maximum 21 days allowed.).  COAG No results found for: INR, PROTIME  Radiology No results found.   Assessment/Plan 1. Pain in both lower extremities  Recommend:  The patient has atypical pain symptoms for pure atherosclerotic disease. However, on physical exam there is evidence of mixed venous and arterial disease, given the diminished pulses and the edema associated with venous changes of the legs.  Noninvasive studies including ABI's and arterial ultrasound of the legs will be obtained and the patient will follow up with me to review these studies.  I suspect the patient is c/o pseudoclaudication.  Patient should have an evaluation of his LS spine which I defer to the primary service.  The patient should continue walking and begin a more formal exercise program. The patient should continue his antiplatelet therapy and aggressive treatment of the lipid abnormalities.  The patient should begin wearing graduated compression socks 15-20 mmHg strength to control edema.   - VAS US AORTA/IVC/ILIACS; Future - VAS [place] ABI WITH/WO TBI; Future  2. PAD (peripheral artery disease) ([place]) See #1  3. Coronary artery disease involving native coronary artery of native heart without angina pectoris Continue cardiac and antihypertensive medications as already ordered and reviewed, no changes at this time.  Continue statin as ordered and reviewed, no changes at this time  Nitrates PRN for chest pain   4. Essential (primary) hypertension Continue antihypertensive  medications as already ordered, these medications have been reviewed and there are no changes at this time.   5. Hyperlipidemia, unspecified hyperlipidemia type Continue statin as ordered and reviewed, no changes at this time    Hortencia Pilar, MD  10/21/2019 1:43 PM

## 2019-10-31 ENCOUNTER — Other Ambulatory Visit: Payer: Self-pay

## 2019-10-31 ENCOUNTER — Ambulatory Visit (INDEPENDENT_AMBULATORY_CARE_PROVIDER_SITE_OTHER): Payer: Medicare Other | Admitting: Podiatry

## 2019-10-31 ENCOUNTER — Encounter: Payer: Self-pay | Admitting: Podiatry

## 2019-10-31 DIAGNOSIS — B351 Tinea unguium: Secondary | ICD-10-CM

## 2019-10-31 DIAGNOSIS — M79674 Pain in right toe(s): Secondary | ICD-10-CM

## 2019-10-31 DIAGNOSIS — I739 Peripheral vascular disease, unspecified: Secondary | ICD-10-CM

## 2019-10-31 DIAGNOSIS — N183 Chronic kidney disease, stage 3 unspecified: Secondary | ICD-10-CM

## 2019-10-31 DIAGNOSIS — M79675 Pain in left toe(s): Secondary | ICD-10-CM | POA: Diagnosis not present

## 2019-10-31 NOTE — Progress Notes (Signed)
This patient returns to my office for at risk foot care.  This patient requires this care by a professional since this patient will be at risk due to having PAD and chronic kidney disease  This patient is unable to cut nails himself since the patient cannot reach his nails.These nails are painful walking and wearing shoes.  This patient presents for at risk foot care today.  General Appearance  Alert, conversant and in no acute stress.  Vascular  Dorsalis pedis and posterior tibial  pulses are palpable  bilaterally.  Capillary return is within normal limits  bilaterally. Temperature is within normal limits  bilaterally.  Neurologic  Senn-Weinstein monofilament wire test within normal limits  bilaterally. Muscle power within normal limits bilaterally.  Nails Thick disfigured discolored nails with subungual debris  from hallux to fifth toes bilaterally. No evidence of bacterial infection or drainage bilaterally.  Orthopedic  No limitations of motion  feet .  No crepitus or effusions noted.  No bony pathology or digital deformities noted.  Skin  normotropic skin with no porokeratosis noted bilaterally.  No signs of infections or ulcers noted.     Onychomycosis  Pain in right toes  Pain in left toes  Consent was obtained for treatment procedures.   Mechanical debridement of nails 1-5  bilaterally performed with a nail nipper.  Filed with dremel without incident.    Return office visit    4 months                 Told patient to return for periodic foot care and evaluation due to potential at risk complications.   Ashni Lonzo DPM  

## 2019-11-16 ENCOUNTER — Other Ambulatory Visit: Payer: Self-pay

## 2019-11-16 ENCOUNTER — Ambulatory Visit (INDEPENDENT_AMBULATORY_CARE_PROVIDER_SITE_OTHER): Payer: Medicare Other

## 2019-11-16 ENCOUNTER — Encounter (INDEPENDENT_AMBULATORY_CARE_PROVIDER_SITE_OTHER): Payer: Self-pay | Admitting: Nurse Practitioner

## 2019-11-16 ENCOUNTER — Ambulatory Visit (INDEPENDENT_AMBULATORY_CARE_PROVIDER_SITE_OTHER): Payer: Medicare Other | Admitting: Nurse Practitioner

## 2019-11-16 VITALS — BP 108/65 | HR 59 | Resp 16 | Wt 284.0 lb

## 2019-11-16 DIAGNOSIS — M79605 Pain in left leg: Secondary | ICD-10-CM | POA: Diagnosis not present

## 2019-11-16 DIAGNOSIS — I10 Essential (primary) hypertension: Secondary | ICD-10-CM | POA: Diagnosis not present

## 2019-11-16 DIAGNOSIS — E785 Hyperlipidemia, unspecified: Secondary | ICD-10-CM

## 2019-11-16 DIAGNOSIS — M79604 Pain in right leg: Secondary | ICD-10-CM

## 2019-11-16 NOTE — Progress Notes (Signed)
Subjective:    Patient ID: Samuel Mason, male    DOB: November 04, 1950, 69 y.o.   MRN: [id number] Chief Complaint  Patient presents with  . Follow-up    ultrasound follow up    The patient presents today for evaluation of bilateral lower extremity leg pain.  The patient notes that this pain began approximately last 5 months ago.  The patient notes that the pain is more significant especially when he stands.  He notes that there is pain, cramping and discomfort near the lateral portion of his calf and sometimes within the anterior portion.  The patient also notes that is not consistently 1 4 or the other it changes back and forth.  The patient also notes that he does also has some pain and cramping with ambulation however it is, again not always consistent or predictable.  There is also some numbness and tingling on the bottom of the feet.  This is also not consistently present.  The patient does endorse having some lower back issues for which she has seen a chiropractor.  His last office visit her was approximately in January at some point time.  The patient also does endorse having some occasional lower extremity edema that generally resolves without issue.  Today noninvasive studies show an ABI of 1.16 on the right and 1.22 on the left.  The right tibial arteries reveals triphasic waveforms with the left tibial arteries revealing mildly biphasic/triphasic waveforms with good toe waveforms bilaterally.  The patient had an aortoiliac duplex which show that the largest aortic diameter was about 1.84 cm.  Throughout the bilateral iliac arteries the patient had triphasic waveforms.  The velocities currently present within the iliac arteries indicates a less than 40% stenosis.   Review of Systems  Cardiovascular: Positive for leg swelling.  Musculoskeletal: Positive for myalgias.  Neurological: Positive for weakness.  All other systems reviewed and are negative.      Objective:   Physical  Exam Vitals reviewed.  HENT:     Head: Normocephalic.  Cardiovascular:     Rate and Rhythm: Normal rate and regular rhythm.     Pulses: Normal pulses.  Pulmonary:     Effort: Pulmonary effort is normal.  Musculoskeletal:        General: Normal range of motion.  Skin:    General: Skin is warm and dry.     Capillary Refill: Capillary refill takes less than 2 seconds.  Neurological:     Mental Status: He is alert and oriented to person, place, and time.  Psychiatric:        Mood and Affect: Mood normal.        Behavior: Behavior normal.        Thought Content: Thought content normal.        Judgment: Judgment normal.     BP 108/65 (BP Location: Right Arm)   Pulse 59   Resp 16   Wt (!) 284 lb (128.8 kg)   BMI 35.50 kg/m   Past Medical History:  Diagnosis Date  . Asymptomatic PVCs    a. 07/2012 Holter: 7965 PVCs in 48 hrs; b. 07/2015 24hr Holter: 6000 PVCs (7%).  [name] CAD (coronary artery disease)    a. 2009 Cath: nonobs dzs; b. 02/2010 Cath: LAD 38m, D1 90, LCX 62m, RCA min irregs, EF 60%-->Med Rx; c. 07/2012 Neg ETT; d. 05/2015 Neg MV. EF 73%.  . Carotid arterial disease ([place])    a. 11/2016 Carotid u/s: mild bilat carotid plaques.  [name]  CKD (chronic kidney disease), stage III    a. Creat 1.67 05/22/2017.  [name] GERD (gastroesophageal reflux disease)    a. noncompliant w/ PPI.  [name] History of echocardiogram    a. 09/2016 Echo: EF 60-65%, no orwma, mild MR, mildly dil LA/RA.  [name] Hyperlipidemia   . Hypertension   . Morbid obesity ([place])   . Obstructive sleep apnea    a. noncompliant w/ CPAP.  [name] TIA (transient ischemic attack)     Social History   Socioeconomic History  . Marital status: Married    Spouse name: Not on file  . Number of children: Not on file  . Years of education: Not on file  . Highest education level: Not on file  Occupational History  . Not on file  Tobacco Use  . Smoking status: Never Smoker  . Smokeless tobacco: Never Used  Vaping Use  . Vaping Use: Never  used  Substance and Sexual Activity  . Alcohol use: No    Alcohol/week: 0.0 standard drinks    Comment: socail drinker  . Drug use: No  . Sexual activity: Not on file  Other Topics Concern  . Not on file  Social History Narrative  . Not on file   Social Determinants of Health   Financial Resource Strain:   . Difficulty of Paying Living Expenses:   Food Insecurity:   . Worried About Charity fundraiser in the Last Year:   . Arboriculturist in the Last Year:   Transportation Needs:   . Film/video editor (Medical):   [name] Lack of Transportation (Non-Medical):   Physical Activity:   . Days of Exercise per Week:   . Minutes of Exercise per Session:   Stress:   . Feeling of Stress :   Social Connections:   . Frequency of Communication with Friends and Family:   . Frequency of Social Gatherings with Friends and Family:   . Attends Religious Services:   . Active Member of Clubs or Organizations:   . Attends Archivist Meetings:   [name] Marital Status:   Intimate Partner Violence:   . Fear of Current or Ex-Partner:   . Emotionally Abused:   [name] Physically Abused:   . Sexually Abused:     Past Surgical History:  Procedure Laterality Date  . CARDIAC CATHETERIZATION  2011   50% mid LAD stenosis, 40% proximal LCX, EF 60%  . CARDIAC CATHETERIZATION  2009   Mild CAD            . COLONOSCOPY WITH PROPOFOL N/A 10/19/2018   Procedure: COLONOSCOPY WITH PROPOFOL;  Surgeon: Jonathon Bellows, MD;  Location: [facility] ENDOSCOPY;  Service: Gastroenterology;  Laterality: N/A;    Family History  Problem Relation Age of Onset  . Heart attack Father        MI    Allergies  Allergen Reactions  . Simvastatin     REACTION: Nausea       Assessment & Plan:   1. Pain in both lower extremities Recommend:  I do not find evidence of Vascular pathology that would explain the patient's symptoms  The patient has atypical pain symptoms for vascular disease  I do not find evidence of  Vascular pathology that would explain the patient's symptoms and I suspect the patient is c/o pseudoclaudication.  Patient should have an evaluation of his LS spine which I defer to the primary service.  Noninvasive studies including venous ultrasound of the legs do not identify vascular  problems  The patient should continue walking and begin a more formal exercise program.  The patient should begin wearing graduated compression socks 15-20 mmHg strength to control his mild edema.  Patient will follow-up with me on a PRN basis, if things should change  Further work-up of her lower extremity pain is deferred to the primary service.  Patient is also advised that visit to his chiropractor may also see relief to symptoms.  The chiropractor is not helpful the patient should refer to primary care for work-up of lower back pain for possible issues with arch support in the bilateral feet.    2. Essential (primary) hypertension Continue antihypertensive medications as already ordered, these medications have been reviewed and there are no changes at this time.   3. Hyperlipidemia, unspecified hyperlipidemia type Continue statin as ordered and reviewed, no changes at this time    Current Outpatient Medications on File Prior to Visit  Medication Sig Dispense Refill  . amLODipine (NORVASC) 10 MG tablet Take 1.5 tablet daily    . aspirin 81 MG tablet Take 81 mg by mouth daily.    . carvedilol (COREG) 6.25 MG tablet Take 6.25 mg by mouth 3 (three) times daily.     . CRESTOR 10 MG tablet Take 10 mg by mouth daily.  3  . eplerenone (INSPRA) 50 MG tablet Take 50 mg by mouth daily.    . famotidine (PEPCID) 20 MG tablet Take by mouth.    . losartan-hydrochlorothiazide (HYZAAR) 100-25 MG per tablet Take 1 tablet by mouth daily.    . Multiple Vitamins-Minerals (MULTIVITAMIN ADULTS PO) Take by mouth.    . Omega-3 Fatty Acids (FISH OIL PO) Take 350 mg by mouth daily.     No current facility-administered  medications on file prior to visit.    There are no Patient Instructions on file for this visit. No follow-ups on file.   Kris Hartmann, NP

## 2020-03-05 ENCOUNTER — Ambulatory Visit: Payer: Medicare Other | Admitting: Podiatry

## 2020-06-28 ENCOUNTER — Other Ambulatory Visit: Payer: Self-pay

## 2020-06-28 ENCOUNTER — Ambulatory Visit (INDEPENDENT_AMBULATORY_CARE_PROVIDER_SITE_OTHER): Payer: Medicare Other | Admitting: Podiatry

## 2020-06-28 ENCOUNTER — Encounter: Payer: Self-pay | Admitting: Podiatry

## 2020-06-28 DIAGNOSIS — I739 Peripheral vascular disease, unspecified: Secondary | ICD-10-CM | POA: Diagnosis not present

## 2020-06-28 DIAGNOSIS — M79675 Pain in left toe(s): Secondary | ICD-10-CM | POA: Diagnosis not present

## 2020-06-28 DIAGNOSIS — N183 Chronic kidney disease, stage 3 unspecified: Secondary | ICD-10-CM

## 2020-06-28 DIAGNOSIS — M79674 Pain in right toe(s): Secondary | ICD-10-CM

## 2020-06-28 DIAGNOSIS — B351 Tinea unguium: Secondary | ICD-10-CM | POA: Diagnosis not present

## 2020-06-28 NOTE — Progress Notes (Signed)
This patient returns to my office for at risk foot care.  This patient requires this care by a professional since this patient will be at risk due to having PAD and chronic kidney disease  This patient is unable to cut nails himself since the patient cannot reach his nails.These nails are painful walking and wearing shoes.  This patient presents for at risk foot care today.  General Appearance  Alert, conversant and in no acute stress.  Vascular  Dorsalis pedis and posterior tibial  pulses are palpable  bilaterally.  Capillary return is within normal limits  bilaterally. Temperature is within normal limits  bilaterally.  Neurologic  Senn-Weinstein monofilament wire test within normal limits  bilaterally. Muscle power within normal limits bilaterally.  Nails Thick disfigured discolored nails with subungual debris  from hallux to fifth toes bilaterally. No evidence of bacterial infection or drainage bilaterally.  Orthopedic  No limitations of motion  feet .  No crepitus or effusions noted.  No bony pathology or digital deformities noted.  Skin  normotropic skin with no porokeratosis noted bilaterally.  No signs of infections or ulcers noted.     Onychomycosis  Pain in right toes  Pain in left toes  Consent was obtained for treatment procedures.   Mechanical debridement of nails 1-5  bilaterally performed with a nail nipper.  Filed with dremel without incident.    Return office visit    4 months                 Told patient to return for periodic foot care and evaluation due to potential at risk complications.   Alegra Rost DPM  

## 2020-11-01 ENCOUNTER — Ambulatory Visit: Payer: Medicare Other | Admitting: Podiatry

## 2021-01-03 ENCOUNTER — Ambulatory Visit: Payer: Medicare Other | Admitting: Podiatry

## 2021-01-29 ENCOUNTER — Other Ambulatory Visit: Payer: Self-pay

## 2021-01-29 ENCOUNTER — Ambulatory Visit (INDEPENDENT_AMBULATORY_CARE_PROVIDER_SITE_OTHER): Payer: Medicare Other | Admitting: Cardiovascular Disease

## 2021-01-29 ENCOUNTER — Encounter: Payer: Self-pay | Admitting: Cardiovascular Disease

## 2021-01-29 VITALS — BP 120/90 | HR 59 | Ht 75.0 in | Wt 286.4 lb

## 2021-01-29 DIAGNOSIS — E785 Hyperlipidemia, unspecified: Secondary | ICD-10-CM | POA: Diagnosis not present

## 2021-01-29 DIAGNOSIS — I493 Ventricular premature depolarization: Secondary | ICD-10-CM

## 2021-01-29 DIAGNOSIS — I10 Essential (primary) hypertension: Secondary | ICD-10-CM | POA: Diagnosis not present

## 2021-01-29 NOTE — Patient Instructions (Signed)

## 2021-01-29 NOTE — Progress Notes (Signed)
Cardiology Office Note   Date:  01/29/2021   ID:  Samuel Mason, DOB [birth year], MRN [id number]  PCP:  [name], MD  Cardiologist:   [name], MD   Chief Complaint  Patient presents with   Other    12 month f/u no complaints today. Meds reviewed verbally with pt.      History of Present Illness: Samuel Mason is a 70 y.o. male who presents for a followup visit regarding hypertension and frequent asymptomatic PVCs.   He is known to have refractory hypertension with no evidence of renal artery stenosis. He also suffers from chronic chest pain likely musculoskeletal with cardiac catheterization done twice in 2009 and 2011. Both times there was mild to moderate CAD without evidence of obstructive disease. He also has known history of sleep apnea does not tolerate CPAP well. He was first noted to have frequent PVCs years ago during his sleep study and has been treated medically with a beta blocker. Echocardiogram 07/2012  showed normal LV systolic function with only mild left ventricular hypertrophy. 4 Most recent treadmill nuclear stress test in February 2019 showed no  evidence of ischemia although he did have frequent PVCs during exercise and in recovery.  He was seen by Dr. Caryl Comes for frequent PVCs.  Carvedilol was increased with subsequent improvement.  He has been doing very well with no chest pain, shortness of breath or palpitations.  He has right knee discomfort and was seen by sports medicine.  He has not been able to exercise for 1 month and has gained some weight as a result.    Past Medical History:  Diagnosis Date   Asymptomatic PVCs    a. 07/2012 Holter: 7965 PVCs in 48 hrs; b. 07/2015 24hr Holter: 6000 PVCs (7%).   CAD (coronary artery disease)    a. 2009 Cath: nonobs dzs; b. 02/2010 Cath: LAD 70m, D1 90, LCX 36m, RCA min irregs, EF 60%-->Med Rx; c. 07/2012 Neg ETT; d. 05/2015 Neg MV. EF 73%.   Carotid arterial disease ([place])    a. 11/2016 Carotid u/s: mild  bilat carotid plaques.   CKD (chronic kidney disease), stage III ([place])    a. Creat 1.67 05/22/2017.   GERD (gastroesophageal reflux disease)    a. noncompliant w/ PPI.   History of echocardiogram    a. 09/2016 Echo: EF 60-65%, no orwma, mild MR, mildly dil LA/RA.   Hyperlipidemia    Hypertension    Morbid obesity ([place])    Obstructive sleep apnea    a. noncompliant w/ CPAP.   TIA (transient ischemic attack)     Past Surgical History:  Procedure Laterality Date   CARDIAC CATHETERIZATION  2011   50% mid LAD stenosis, 40% proximal LCX, EF 60%   CARDIAC CATHETERIZATION  2009   Mild CAD             COLONOSCOPY WITH PROPOFOL N/A 10/19/2018   Procedure: COLONOSCOPY WITH PROPOFOL;  Surgeon: Jonathon Bellows, MD;  Location: [facility] ENDOSCOPY;  Service: Gastroenterology;  Laterality: N/A;     Current Outpatient Medications  Medication Sig Dispense Refill   amLODipine (NORVASC) 10 MG tablet Take 1.5 tablet daily     Ascorbic Acid (VITAMIN C PO) Take by mouth daily.     aspirin 81 MG tablet Take 81 mg by mouth daily.     carvedilol (COREG) 6.25 MG tablet Take 6.25 mg by mouth 3 (three) times daily.      CRESTOR 10 MG tablet Take 10  mg by mouth daily.  3   eplerenone (INSPRA) 50 MG tablet Take 50 mg by mouth daily.     famotidine (PEPCID) 20 MG tablet Take by mouth.     losartan-hydrochlorothiazide (HYZAAR) 100-25 MG per tablet Take 1 tablet by mouth daily.     Multiple Vitamins-Minerals (MULTIVITAMIN ADULTS PO) Take by mouth.     Omega-3 Fatty Acids (FISH OIL PO) Take 350 mg by mouth daily.     No current facility-administered medications for this visit.    Allergies:   Simvastatin    Social History:  The patient  reports that he has never smoked. He has never used smokeless tobacco. He reports that he does not drink alcohol and does not use drugs.   Family History:  The patient's family history includes Heart attack in his father.    ROS:  Please see the history of present illness.    Otherwise, review of systems are positive for none.   All other systems are reviewed and negative.    PHYSICAL EXAM: VS:  BP 120/90 (BP Location: Left Arm, Patient Position: Sitting, Cuff Size: Large) Comment: After EKG  Pulse (!) 59   Ht 6\' 3"  (1.905 m)   Wt 286 lb 6 oz (129.9 kg)   SpO2 98%   BMI 35.79 kg/m  , BMI Body mass index is 35.79 kg/m. GEN: Well nourished, well developed, in no acute distress  HEENT: normal  Neck: no JVD, carotid bruits, or masses Cardiac: RRR with premature beats; no murmurs, rubs, or gallops,no edema  Respiratory:  clear to auscultation bilaterally, normal work of breathing GI: soft, nontender, nondistended, + BS MS: no deformity or atrophy  Skin: warm and dry, no rash Neuro:  Strength and sensation are intact Psych: euthymic mood, full affect Vascular: Femoral pulses normal bilaterally.  Posterior tibial is palpable.   EKG:  EKG is ordered today. The ekg ordered today demonstrates sinus bradycardia with sinus arrhythmia and first-degree AV block.  Possible old anterior infarct.   Recent Labs: No results found for requested labs within last 8760 hours.    Lipid Panel    Component Value Date/Time   CHOL 234 (HH) 06/22/2006 1553   TRIG 91 06/22/2006 1553   HDL 36.4 (L) 06/22/2006 1553   CHOLHDL 6.4 CALC 06/22/2006 1553   VLDL 18 06/22/2006 1553   LDLDIRECT 183.4 06/22/2006 1553      Wt Readings from Last 3 Encounters:  01/29/21 286 lb 6 oz (129.9 kg)  11/16/19 (!) 284 lb (128.8 kg)  10/20/19 282 lb (127.9 kg)         ASSESSMENT AND PLAN:  1. Asymptomatic PVCs: Treated with carvedilol.  He is asymptomatic and has normal LV systolic function.  No PVCs noted today by EKG or physical exam.  2. Refractory hypertension: Blood pressure is well controlled on current medications.  I will consider renal denervation in the future if he qualifies.  3. Hyperlipidemia: Continue treatment with rosuvastatin with a target LDL of less than 70.  This is followed by Dr. Rosario Jacks.   4.  Chronic kidney disease: Most recent creatinine was 1.8.    Disposition:   FU with me in 1 year  Signed,  [name], MD  01/29/2021 3:26 PM    [place]

## 2021-03-28 ENCOUNTER — Ambulatory Visit: Payer: Medicare Other | Admitting: Podiatry

## 2021-04-18 ENCOUNTER — Other Ambulatory Visit: Payer: Self-pay

## 2021-04-18 ENCOUNTER — Encounter: Payer: Self-pay | Admitting: Podiatry

## 2021-04-18 ENCOUNTER — Ambulatory Visit (INDEPENDENT_AMBULATORY_CARE_PROVIDER_SITE_OTHER): Payer: Medicare Other | Admitting: Podiatry

## 2021-04-18 DIAGNOSIS — I739 Peripheral vascular disease, unspecified: Secondary | ICD-10-CM

## 2021-04-18 DIAGNOSIS — M79675 Pain in left toe(s): Secondary | ICD-10-CM

## 2021-04-18 DIAGNOSIS — N183 Chronic kidney disease, stage 3 unspecified: Secondary | ICD-10-CM

## 2021-04-18 DIAGNOSIS — B351 Tinea unguium: Secondary | ICD-10-CM | POA: Diagnosis not present

## 2021-04-18 DIAGNOSIS — M79674 Pain in right toe(s): Secondary | ICD-10-CM

## 2021-04-18 NOTE — Progress Notes (Signed)
This patient returns to my office for at risk foot care.  This patient requires this care by a professional since this patient will be at risk due to having PAD and chronic kidney disease  This patient is unable to cut nails himself since the patient cannot reach his nails.These nails are painful walking and wearing shoes.  This patient presents for at risk foot care today.  General Appearance  Alert, conversant and in no acute stress.  Vascular  Dorsalis pedis and posterior tibial  pulses are palpable  bilaterally.  Capillary return is within normal limits  bilaterally. Temperature is within normal limits  bilaterally.  Neurologic  Senn-Weinstein monofilament wire test within normal limits  bilaterally. Muscle power within normal limits bilaterally.  Nails Thick disfigured discolored nails with subungual debris  from hallux to fifth toes bilaterally. No evidence of bacterial infection or drainage bilaterally.  Orthopedic  No limitations of motion  feet .  No crepitus or effusions noted.  No bony pathology or digital deformities noted.  Skin  normotropic skin with no porokeratosis noted bilaterally.  No signs of infections or ulcers noted.     Onychomycosis  Pain in right toes  Pain in left toes  Consent was obtained for treatment procedures.   Mechanical debridement of nails 1-5  bilaterally performed with a nail nipper.  Filed with dremel without incident.    Return office visit    4 months                 Told patient to return for periodic foot care and evaluation due to potential at risk complications.   Carnella Fryman DPM  

## 2021-07-25 ENCOUNTER — Ambulatory Visit: Payer: Medicare Other | Admitting: Podiatry

## 2021-09-13 ENCOUNTER — Telehealth: Payer: Self-pay

## 2021-09-13 ENCOUNTER — Ambulatory Visit (INDEPENDENT_AMBULATORY_CARE_PROVIDER_SITE_OTHER): Payer: Medicare Other

## 2021-09-13 DIAGNOSIS — I493 Ventricular premature depolarization: Secondary | ICD-10-CM

## 2021-09-13 DIAGNOSIS — I2510 Atherosclerotic heart disease of native coronary artery without angina pectoris: Secondary | ICD-10-CM

## 2021-09-13 NOTE — Telephone Encounter (Addendum)
Per Dr. Tyrell Antonio secure chat:  "spoke with Dr. Rosario Jacks.  This patient is having increased PVCs.  Please schedule him for 1 week ZIO monitor and echocardiogram.  Follow-up with me after."  Patient made aware of Dr. Tyrell Antonio recommendation and is agreeable with the POC. Advised the patient that he will receive the zio monitor in the mail. He is to follow the application and return instructions. Pt made aware that the monitor is to be worn for 7 days ( order accidentally placed for 14). Advised the patient that a scheduler will contact him to schedule the echo and f/u appt. Patient verbalized understanding and voiced appreciation for the call.

## 2021-09-13 NOTE — Telephone Encounter (Signed)
LMOV to schedule  

## 2021-09-18 DIAGNOSIS — I493 Ventricular premature depolarization: Secondary | ICD-10-CM

## 2021-09-23 ENCOUNTER — Encounter: Payer: Self-pay | Admitting: Podiatry

## 2021-09-23 ENCOUNTER — Ambulatory Visit (INDEPENDENT_AMBULATORY_CARE_PROVIDER_SITE_OTHER): Payer: Medicare Other | Admitting: Podiatry

## 2021-09-23 DIAGNOSIS — B351 Tinea unguium: Secondary | ICD-10-CM

## 2021-09-23 DIAGNOSIS — M79675 Pain in left toe(s): Secondary | ICD-10-CM

## 2021-09-23 DIAGNOSIS — I739 Peripheral vascular disease, unspecified: Secondary | ICD-10-CM

## 2021-09-23 DIAGNOSIS — N183 Chronic kidney disease, stage 3 unspecified: Secondary | ICD-10-CM

## 2021-09-23 DIAGNOSIS — M79674 Pain in right toe(s): Secondary | ICD-10-CM | POA: Diagnosis not present

## 2021-09-23 NOTE — Progress Notes (Signed)
This patient returns to my office for at risk foot care.  This patient requires this care by a professional since this patient will be at risk due to having PAD and chronic kidney disease  This patient is unable to cut nails himself since the patient cannot reach his nails.These nails are painful walking and wearing shoes.  This patient presents for at risk foot care today.  General Appearance  Alert, conversant and in no acute stress.  Vascular  Dorsalis pedis and posterior tibial  pulses are palpable  bilaterally.  Capillary return is within normal limits  bilaterally. Temperature is within normal limits  bilaterally.  Neurologic  Senn-Weinstein monofilament wire test within normal limits  bilaterally. Muscle power within normal limits bilaterally.  Nails Thick disfigured discolored nails with subungual debris  from hallux to fifth toes bilaterally. No evidence of bacterial infection or drainage bilaterally.  Orthopedic  No limitations of motion  feet .  No crepitus or effusions noted.  No bony pathology or digital deformities noted.  Skin  normotropic skin with no porokeratosis noted bilaterally.  No signs of infections or ulcers noted.     Onychomycosis  Pain in right toes  Pain in left toes  Consent was obtained for treatment procedures.   Mechanical debridement of nails 1-5  bilaterally performed with a nail nipper.  Filed with dremel without incident.    Return office visit    4 months                 Told patient to return for periodic foot care and evaluation due to potential at risk complications.   Ronni Osterberg DPM  

## 2021-09-24 ENCOUNTER — Telehealth: Payer: Self-pay | Admitting: Cardiovascular Disease

## 2021-09-24 NOTE — Telephone Encounter (Signed)
Reviewed with patients wife that he is to wear for 7 days and then return in box provided. She just wanted to confirm and had no further questions or needs at this time.

## 2021-09-24 NOTE — Telephone Encounter (Signed)
Patient's wife states they received his heart monitor. The instructions state that he needs to wear it for 2 weeks, but he was advised by our office to only wear it for 1 week. Please advise.

## 2021-10-03 ENCOUNTER — Telehealth: Payer: Self-pay

## 2021-10-03 NOTE — Telephone Encounter (Signed)
-----   Message from Wellington Hampshire, MD sent at 10/03/2021 11:23 AM EDT ----- Inform patient that outpatient monitor showed frequent PVCs but less than what I expected.  Continue same medications.  Proceed with echo as planned.

## 2021-10-03 NOTE — Telephone Encounter (Signed)
Patient made aware of monitor results with verbalized understanding.

## 2021-10-03 NOTE — Telephone Encounter (Signed)
Called to give the patient monitor results. Lmtcb. ?

## 2021-10-04 ENCOUNTER — Ambulatory Visit (INDEPENDENT_AMBULATORY_CARE_PROVIDER_SITE_OTHER): Payer: Medicare Other

## 2021-10-04 DIAGNOSIS — I2510 Atherosclerotic heart disease of native coronary artery without angina pectoris: Secondary | ICD-10-CM

## 2021-10-04 LAB — ECHOCARDIOGRAM COMPLETE
AR max vel: 3.5 cm2
AV Area VTI: 3.5 cm2
AV Area mean vel: 3.55 cm2
AV Mean grad: 2 mmHg
AV Peak grad: 4.4 mmHg
Ao pk vel: 1.05 m/s
Area-P 1/2: 3.76 cm2
Calc EF: 54 %
S' Lateral: 2.8 cm
Single Plane A2C EF: 54.2 %
Single Plane A4C EF: 52.3 %

## 2021-10-08 ENCOUNTER — Encounter: Payer: Self-pay | Admitting: Medical

## 2021-10-08 ENCOUNTER — Ambulatory Visit (INDEPENDENT_AMBULATORY_CARE_PROVIDER_SITE_OTHER): Payer: Medicare Other | Admitting: Medical

## 2021-10-08 VITALS — BP 132/82 | HR 63 | Ht 75.0 in | Wt 271.4 lb

## 2021-10-08 DIAGNOSIS — I491 Atrial premature depolarization: Secondary | ICD-10-CM

## 2021-10-08 DIAGNOSIS — E785 Hyperlipidemia, unspecified: Secondary | ICD-10-CM

## 2021-10-08 DIAGNOSIS — I10 Essential (primary) hypertension: Secondary | ICD-10-CM | POA: Diagnosis not present

## 2021-10-08 DIAGNOSIS — N183 Chronic kidney disease, stage 3 unspecified: Secondary | ICD-10-CM

## 2021-10-08 DIAGNOSIS — I471 Supraventricular tachycardia: Secondary | ICD-10-CM | POA: Diagnosis not present

## 2021-10-08 DIAGNOSIS — I493 Ventricular premature depolarization: Secondary | ICD-10-CM

## 2021-10-08 DIAGNOSIS — I2510 Atherosclerotic heart disease of native coronary artery without angina pectoris: Secondary | ICD-10-CM | POA: Diagnosis not present

## 2021-10-08 NOTE — Addendum Note (Signed)
Addended by: Darlyne Russian on: 10/08/2021 04:54 PM   Modules accepted: Orders

## 2021-10-08 NOTE — Patient Instructions (Signed)
Medication Instructions:   Your physician recommends that you continue on your current medications as directed. Please refer to the Current Medication list given to you today.  *If you need a refill on your cardiac medications before your next appointment, please call your pharmacy*   Lab Work:  None ordered  Testing/Procedures:  None ordered   Follow-Up: At [facility], you and your health needs are our priority.  As part of our continuing mission to provide you with exceptional heart care, we have created designated Provider Care Teams.  These Care Teams include your primary Cardiologist (physician) and Advanced Practice Providers (APPs -  Physician Assistants and Nurse Practitioners) who all work together to provide you with the care you need, when you need it.  We recommend signing up for the patient portal called "MyChart".  Sign up information is provided on this After Visit Summary.  MyChart is used to connect with patients for Virtual Visits (Telemedicine).  Patients are able to view lab/test results, encounter notes, upcoming appointments, etc.  Non-urgent messages can be sent to your provider as well.   To learn more about what you can do with MyChart, go to [email or web address].    Your next appointment:    1) Follow up with Dr. Caryl Comes  2) 3 month(s) with Dr. Fletcher Anon or APP  The format for your next appointment:   In Person  Provider:   You may see [name], MD or one of the following Advanced Practice Providers on your designated Care Team:    Cadence Kathlen Mody, [place]    Important Information About Sugar

## 2021-10-08 NOTE — Progress Notes (Signed)
Cardiology Office Note:    Date:  10/08/2021   ID:  Samuel Mason, DOB [birth year], MRN [id number]  PCP:  [name], MD  [facility] Cardiologist:  [name], MD  [facility] Electrophysiologist:  None   Referring MD: [name], MD   Chief Complaint: Echo and Heart monitor follow-up  History of Present Illness:    Samuel Mason is a 71 y.o. male with a hx of HTN, PVCs, chronic chest pain, mild to mod CAD, OSA does not tolerate CPAP who presents for testing follow-up  H/o chronic chest pain likely MSK in nature. 2 prior cardiac caths in 2009 and 2011 showing mild to moderate CAD.  H/o uncontrolled HTN, no RAS.   Echo in 05/2012 showed normal LVSF and mild LVH. Nuclear treadmill test in 2019 showed no ischemia, although he did have frequent PVCs.   Saw EP for PVCs. Coreg was increased with improvement.   Last seen 01/2021 and was overall stable.   Today, the patient reports PCP had a stress test a month ago. It was a treadmill test and it was abnormal. He asked Dr. Fletcher Anon to get an echo and heart monitor. Echo showed normal LVEF, mild LVH, mild to moderate MR. Heart monitor showed NSR, 18 runs NSVT, and >1500 runs of SVT (related to triggered events), PACs and PVCs. He was set up for an appointment.   Patient denies chest pain or SOB. He has occasional LLE. He has OSA, but does not use CPAP. He has woken up at night from palpitations, thought they were panic attacks. EKG today showed NSR, 1st degree AV block 63bpm, with PACs and PVCs.   Past Medical History:  Diagnosis Date   Asymptomatic PVCs    a. 07/2012 Holter: 7965 PVCs in 48 hrs; b. 07/2015 24hr Holter: 6000 PVCs (7%).   CAD (coronary artery disease)    a. 2009 Cath: nonobs dzs; b. 02/2010 Cath: LAD 22m D1 90, LCX 458mRCA min irregs, EF 60%-->Med Rx; c. 07/2012 Neg ETT; d. 05/2015 Neg MV. EF 73%.   [place]   a. 11/2016 Carotid u/s: mild bilat carotid plaques.   CKD (chronic kidney  disease), [place]   a. Creat 1.67 05/22/2017.   GERD (gastroesophageal reflux disease)    a. noncompliant w/ PPI.   History of echocardiogram    a. 09/2016 Echo: EF 60-65%, no orwma, mild MR, mildly dil LA/RA.   Hyperlipidemia    Hypertension    Morbid obesity ([place]   Obstructive sleep apnea    a. noncompliant w/ CPAP.   TIA (transient ischemic attack)     Past Surgical History:  Procedure Laterality Date   CARDIAC CATHETERIZATION  2011   50% mid LAD stenosis, 40% proximal LCX, EF 60%   CARDIAC CATHETERIZATION  2009   Mild CAD             COLONOSCOPY WITH PROPOFOL N/A 10/19/2018   Procedure: COLONOSCOPY WITH PROPOFOL;  Surgeon: AnJonathon BellowsMD;  [facility];  Service: Gastroenterology;  Laterality: N/A;    Current Medications: Current Meds  Medication Sig   amLODipine (NORVASC) 10 MG tablet Take 1.5 tablet daily   Ascorbic Acid (VITAMIN C PO) Take by mouth daily.   aspirin 81 MG tablet Take 81 mg by mouth daily.   carvedilol (COREG) 6.25 MG tablet Take 6.25 mg by mouth 3 (three) times daily.    CRESTOR 10 MG tablet Take 10 mg by mouth  as needed.   eplerenone (INSPRA) 50 MG tablet Take 50 mg by mouth daily.   famotidine (PEPCID) 20 MG tablet Take by mouth.   losartan-hydrochlorothiazide (HYZAAR) 100-25 MG per tablet Take 1 tablet by mouth daily.   Multiple Vitamins-Minerals (MULTIVITAMIN ADULTS PO) Take 1 tablet by mouth daily.   Omega-3 Fatty Acids (FISH OIL PO) Take 350 mg by mouth daily.     Allergies:   Simvastatin   Social History   Socioeconomic History   Marital status: Married    Spouse name: Not on file   Number of children: Not on file   Years of education: Not on file   Highest education level: Not on file  Occupational History   Not on file  Tobacco Use   Smoking status: Never   Smokeless tobacco: Never  Vaping Use   Vaping Use: Never used  Substance and Sexual Activity   Alcohol use: No    Alcohol/week: 0.0 standard drinks of  alcohol    Comment: socail drinker   Drug use: No   Sexual activity: Not on file  Other Topics Concern   Not on file  Social History Narrative   Not on file   Social Determinants of Health   Financial Resource Strain: Not on file  Food Insecurity: Not on file  Transportation Needs: Not on file  Physical Activity: Not on file  Stress: Not on file  Social Connections: Not on file     Family History: The patient's family history includes Heart attack in his father.  ROS:   Please see the history of present illness.     All other systems reviewed and are negative.  EKGs/Labs/Other Studies Reviewed:    The following studies were reviewed today:  Echo 09/2021  1. Left ventricular ejection fraction, by estimation, is 60 to 65%. The  left ventricle has normal function. The left ventricle has no regional  wall motion abnormalities. There is mild left ventricular hypertrophy.  Left ventricular diastolic parameters  are indeterminate.   2. Right ventricular systolic function is normal. The right ventricular  size is normal. There is normal pulmonary artery systolic pressure. The  estimated right ventricular systolic pressure is 56.3 mmHg.   3. Left atrial size was mildly dilated.   4. The mitral valve is normal in structure. Mild to moderate mitral valve  regurgitation. No evidence of mitral stenosis.   5. The aortic valve is normal in structure. Aortic valve regurgitation is  not visualized. Aortic valve sclerosis is present, with no evidence of  aortic valve stenosis.   6. The inferior vena cava is normal in size with greater than 50%  respiratory variability, suggesting right atrial pressure of 3 mmHg.   Heart monitor 09/2021 Patch Wear Time:  8 days and 2 hours (2023-05-31T13:17:37-0400 to 2023-06-08T16:05:18-0400)   Patient had a min HR of 39 bpm, max HR of 171 bpm, and avg HR of 66 bpm. Predominant underlying rhythm was Sinus Rhythm. First Degree AV Block was present.  18  Ventricular Tachycardia runs occurred, the run with the fastest interval lasting 5 beats with a max rate of 171 bpm, the longest lasting 8 beats with an avg rate of 137 bpm.  1534 Supraventricular Tachycardia runs occurred, the run with the fastest interval lasting 4 beats with a max rate of 167 bpm, the longest lasting 1 min 0 secs with an avg rate of 92 bpm. Supraventricular Tachycardia was detected within +/- 45 seconds of symptomatic patient event(s). Frequent PACs with  a burden of 6%. Frequent PVCs with a burden of 6.1%.    EKG:  EKG is  ordered today.  The ekg ordered today demonstrates NSR, 1st AV block, PAC/PVC, PRI 259m, low voltage, Lad, nonspecific T wave changes  Recent Labs: No results found for requested labs within last 365 days.  Recent Lipid Panel    Component Value Date/Time   CHOL 234 (HH) 06/22/2006 1553   TRIG 91 06/22/2006 1553   HDL 36.4 (L) 06/22/2006 1553   CHOLHDL 6.4 CALC 06/22/2006 1553   VLDL 18 06/22/2006 1553   LDLDIRECT 183.4 06/22/2006 1553    Physical Exam:    VS:  BP 132/82 (BP Location: Left Arm, Patient Position: Sitting, Cuff Size: Large)   Pulse 63   Ht '6\' 3"'$  (1.905 m)   Wt 271 lb 6.4 oz (123.1 kg)   SpO2 99%   BMI 33.92 kg/m     Wt Readings from Last 3 Encounters:  10/08/21 271 lb 6.4 oz (123.1 kg)  01/29/21 286 lb 6 oz (129.9 kg)  11/16/19 (!) 284 lb (128.8 kg)     GEN:  Well nourished, well developed in no acute distress HEENT: Normal NECK: No JVD; No carotid bruits LYMPHATICS: No lymphadenopathy CARDIAC: RRR, no murmurs, rubs, gallops RESPIRATORY:  Clear to auscultation without rales, wheezing or rhonchi  ABDOMEN: Soft, non-tender, non-distended MUSCULOSKELETAL:  No edema; No deformity  SKIN: Warm and dry NEUROLOGIC:  Alert and oriented x 3 PSYCHIATRIC:  Normal affect   ASSESSMENT:    1. Paroxysmal SVT (supraventricular tachycardia) ([place]   2. Coronary artery disease involving native coronary artery of native heart  without angina pectoris   3. Frequent PVCs   4. Hypertension, unspecified type   5. Hyperlipidemia, unspecified hyperlipidemia type   6. Stage 3 chronic kidney disease, unspecified whether stage 3a or 3b CKD ([place]   7. PAC (premature atrial contraction)    PLAN:    In order of problems listed above:  pSVT/PACs/PVCs Known h/o PVCs on Coreg. Prior cardiac caths in 2009 and 2011 with nonobstructive CAD. Recent heart monitor showed 1534 runs of SVT, longest 1 min, related to patent triggered events, 18 runs of NSVT, longest 8 beats, 6% PACs, PVCs 6.1%. Caution with increase of Coreg due to 1st degree AV block and heart rate of 63bpm. I will request labs from PCP. I will refer to EP for further recommendations.  HTN BP is good today, continue current medications  HLD Will request labs as above. Continue Crestor  CKD Stable on check 06/2021 with Scr 1.72.   OSA Non-compliant with CPAP, can discuss at follow-up.  Disposition: Follow up in 3 month(s) with MD/APP    Signed, Marleigh Kaylor HNinfa Meeker PA-C  10/08/2021 4:15 PM    [facility]

## 2021-10-15 ENCOUNTER — Other Ambulatory Visit: Payer: Self-pay | Admitting: Medical

## 2021-10-15 DIAGNOSIS — I2510 Atherosclerotic heart disease of native coronary artery without angina pectoris: Secondary | ICD-10-CM

## 2021-10-16 NOTE — Addendum Note (Signed)
Addended by: Raelene Bott, Jaidee Stipe L on: 10/16/2021 08:45 AM   Modules accepted: Orders

## 2021-10-17 ENCOUNTER — Encounter: Payer: Self-pay | Admitting: Internal Medicine

## 2021-10-17 ENCOUNTER — Ambulatory Visit (INDEPENDENT_AMBULATORY_CARE_PROVIDER_SITE_OTHER): Payer: Medicare Other | Admitting: Internal Medicine

## 2021-10-17 VITALS — BP 128/80 | HR 69 | Ht 75.0 in | Wt 265.0 lb

## 2021-10-17 DIAGNOSIS — I493 Ventricular premature depolarization: Secondary | ICD-10-CM | POA: Diagnosis not present

## 2021-10-17 DIAGNOSIS — I471 Supraventricular tachycardia: Secondary | ICD-10-CM

## 2021-10-17 DIAGNOSIS — I10 Essential (primary) hypertension: Secondary | ICD-10-CM

## 2021-10-17 DIAGNOSIS — I491 Atrial premature depolarization: Secondary | ICD-10-CM

## 2021-10-17 DIAGNOSIS — G4730 Sleep apnea, unspecified: Secondary | ICD-10-CM

## 2021-10-17 MED ORDER — NEBIVOLOL HCL 5 MG PO TABS
5.0000 mg | ORAL_TABLET | Freq: Every day | ORAL | 0 refills | Status: DC
Start: 2021-10-17 — End: 2022-01-10

## 2021-10-17 MED ORDER — BISOPROLOL FUMARATE 5 MG PO TABS: 5.0000 mg | ORAL_TABLET | Freq: Every day | ORAL | 0 refills | Status: DC

## 2021-10-17 NOTE — Patient Instructions (Signed)
Medication Instructions:  - Your physician has recommended you make the following change in your medication:   1) STOP coreg (carvedilol)  2) You have been given 2 different beta blockers to try. You may take them in any order, but DO NOT take more than 1 at a time. Please try to give at least 2 weeks on each medication to see what works the best for you. If you decide which 1 you like, please call the office at [phone] so we may send in additional refills for you.  Bisoprolol 5 mg: take 1 tablet by mouth once daily  Nebivolol 5 mg: take 1 tablet by mouth once daily    *If you need a refill on your cardiac medications before your next appointment, please call your pharmacy*   Lab Work: - none ordered  If you have labs (blood work) drawn today and your tests are completely normal, you will receive your results only by: [place] (if you have MyChart) OR A paper copy in the mail If you have any lab test that is abnormal or we need to change your treatment, we will call you to review the results.   Testing/Procedures:  1) You have been referred to: [facility] - Their office will reach out to you directly for an appointment, but if you do not hear from them after a weeks time, please call their office at [phone] to follow up.    Follow-Up: At [facility], you and your health needs are our priority.  As part of our continuing mission to provide you with exceptional heart care, we have created designated Provider Care Teams.  These Care Teams include your primary Cardiologist (physician) and Advanced Practice Providers (APPs -  Physician Assistants and Nurse Practitioners) who all work together to provide you with the care you need, when you need it.  We recommend signing up for the patient portal called "MyChart".  Sign up information is provided on this After Visit Summary.  MyChart is used to connect with patients for Virtual Visits (Telemedicine).   Patients are able to view lab/test results, encounter notes, upcoming appointments, etc.  Non-urgent messages can be sent to your provider as well.   To learn more about what you can do with MyChart, go to [email or web address].    Your next appointment:   As scheduled   The format for your next appointment:   In Person  Provider:   [name], MD    Other Instructions N/a  Important Information About Sugar

## 2021-10-17 NOTE — Progress Notes (Signed)
ELECTROPHYSIOLOGY CONSULT NOTE  Patient ID: Samuel Mason, MRN: [id number], DOB/AGE: May 09, 1950 71 y.o. Admit date: (Not on file) Date of Consult: 10/17/2021  Primary Physician: [name], MD Primary Cardiologist: MA     Samuel Mason is a 71 y.o. male who is being seen today for the evaluation of abnormal stress test at the request of MA.    HPI Samuel Mason is a 71 y.o. male complex and frequent ectopy.  These palpitations are most problematic at night when he goes to sleep particular lying on his side.  He notices him occasionally during the day but is not associated with shortness of breath or lightheadedness.  I had seen him in 2019 for PVCs associated with palpitations.  Treated with carvedilol with significant interval improvement.   Saw his PCP and underwent stress testing, the results of which are not available but because of ectopy  Dr. Gaylyn Cheers ordered a Zio patch.  Outlined below, showed multiple episodes of an atrial tachycardia as well as some episodes of nonsustained ventricular tachycardia   Hx of poorly controlled hypertension for which he was seen previously at [facility]  Untreated sleep apnea   DATE TEST EF    11/11 LHC   nonobs CAD  4/14 Myoview  73 % No ischemia  6/18 Echo  60-65%    3/19 Myoview   No ischemia  5/23 Myoview    6/23 Echo  60-65%     Date PVCs  4/14 7%  4/17 6%  3/19 9%--Multiple morphologies AIVR   6/23 6.1%       Date Cr K Hgb  3/23 1.72 3.8 13.9            Past Medical History:  Diagnosis Date   Asymptomatic PVCs    a. 07/2012 Holter: 7965 PVCs in 48 hrs; b. 07/2015 24hr Holter: 6000 PVCs (7%).   CAD (coronary artery disease)    a. 2009 Cath: nonobs dzs; b. 02/2010 Cath: LAD 68m D1 90, LCX 426mRCA min irregs, EF 60%-->Med Rx; c. 07/2012 Neg ETT; d. 05/2015 Neg MV. EF 73%.   Carotid arterial disease ([facility]   a. 11/2016 Carotid u/s: mild bilat carotid plaques.   CKD (chronic kidney disease), stage III ([facility]   a. Creat  1.67 05/22/2017.   GERD (gastroesophageal reflux disease)    a. noncompliant w/ PPI.   History of echocardiogram    a. 09/2016 Echo: EF 60-65%, no orwma, mild MR, mildly dil LA/RA.   Hyperlipidemia    Hypertension    Morbid obesity ([place]   Obstructive sleep apnea    a. noncompliant w/ CPAP.   TIA (transient ischemic attack)       Surgical History:  Past Surgical History:  Procedure Laterality Date   CARDIAC CATHETERIZATION  2011   50% mid LAD stenosis, 40% proximal LCX, EF 60%   CARDIAC CATHETERIZATION  2009   Mild CAD             COLONOSCOPY WITH PROPOFOL N/A 10/19/2018   Procedure: COLONOSCOPY WITH PROPOFOL;  Surgeon: AnJonathon BellowsMD;  [facility];  Service: Gastroenterology;  Laterality: N/A;     Home Meds: Current Meds  Medication Sig   amLODipine (NORVASC) 10 MG tablet Take 1.5 tablet daily   Ascorbic Acid (VITAMIN C PO) Take by mouth daily.   aspirin 81 MG tablet Take 81 mg by mouth daily.   carvedilol (COREG) 6.25 MG tablet Take 6.25 mg by mouth  3 (three) times daily.    CRESTOR 10 MG tablet Take 10 mg by mouth as needed.   eplerenone (INSPRA) 50 MG tablet Take 50 mg by mouth daily.   famotidine (PEPCID) 20 MG tablet Take by mouth.   losartan-hydrochlorothiazide (HYZAAR) 100-25 MG per tablet Take 1 tablet by mouth daily.   Multiple Vitamins-Minerals (MULTIVITAMIN ADULTS PO) Take 1 tablet by mouth daily.   Omega-3 Fatty Acids (FISH OIL PO) Take 350 mg by mouth daily.    Allergies:  Allergies  Allergen Reactions   Simvastatin     REACTION: Nausea    Social History   Socioeconomic History   Marital status: Married    Spouse name: Not on file   Number of children: Not on file   Years of education: Not on file   Highest education level: Not on file  Occupational History   Not on file  Tobacco Use   Smoking status: Never   Smokeless tobacco: Never  Vaping Use   Vaping Use: Never used  Substance and Sexual Activity   Alcohol use: No     Alcohol/week: 0.0 standard drinks of alcohol    Comment: socail drinker   Drug use: No   Sexual activity: Not on file  Other Topics Concern   Not on file  Social History Narrative   Not on file   Social Determinants of Health   Financial Resource Strain: Not on file  Food Insecurity: Not on file  Transportation Needs: Not on file  Physical Activity: Not on file  Stress: Not on file  Social Connections: Not on file  Intimate Partner Violence: Not on file     Family History  Problem Relation Age of Onset   Heart attack Father        MI     ROS:  Please see the history of present illness.     All other systems reviewed and negative.    Physical Exam: Blood pressure 128/80, pulse 69, height '6\' 3"'$  (1.905 m), weight 265 lb (120.2 kg). General: Well developed, well nourished male in no acute distress. Head: Normocephalic, atraumatic, sclera non-icteric, no xanthomas, nares are without discharge. EENT: normal  Lymph Nodes:  none Neck: Negative for carotid bruits. JVD not elevated. Back:without scoliosis kyphosis Lungs: Clear bilaterally to auscultation without wheezes, rales, or rhonchi. Breathing is unlabored. Heart: RRR with S1 S2. No  murmur . No rubs, or gallops appreciated. Abdomen: Soft, non-tender, non-distended with normoactive bowel sounds. No hepatomegaly. No rebound/guarding. No obvious abdominal masses. Msk:  Strength and tone appear normal for age. Extremities: No clubbing or cyanosis. No  edema.  Distal pedal pulses are 2+ and equal bilaterally. Skin: Warm and Dry Neuro: Alert and oriented X 3. CN III-XII intact Grossly normal sensory and motor function . Psych:  Responds to questions appropriately with a normal affect.        EKG: sinus @ 69 20/10/42  PACS and OP   Assessment and Plan:   AIVR   Atrial tachycardia   PVCs   Low voltage   Sinus bradycardia (relative)   Coronary disease nonobstructive   Chest pain-atypical   Sleep  apnea-untreated   Hypertension-poorly controlled  Will change carvedilol to bisoprolol and nebivolol to see thhe former is contributing to fatigue  Palpitations are worse at night, but by histogram data, they are fairly constant I suspect it is related to the quietness of the evening and trying to be supine.  We discussed lying on his right side.  We also discussed the potential role of a class Ic drug.  However, to do this we would need to clarify that he does not have obstructive coronary disease; in this vein, we were able to obtain a report of his Myoview scan from 2023.  It describes a anterior wall perfusion defect and this in contradistinction to normal perfusion studies years before.  He has had chest pains for some years.  He is able to push through them.  I will ask Dr. Audelia Acton to review this with him next week and they can make a decision at that time as to whether to proceed with catheterization for augmented medical therapy.  Have encouraged him to follow-up with pulmonary to reestablish CPAP therapy.     Virl Axe

## 2021-10-18 ENCOUNTER — Encounter: Payer: Self-pay | Admitting: Primary Care

## 2021-10-18 ENCOUNTER — Ambulatory Visit (INDEPENDENT_AMBULATORY_CARE_PROVIDER_SITE_OTHER): Payer: Medicare Other | Admitting: Primary Care

## 2021-10-18 VITALS — BP 110/70 | HR 52 | Temp 98.0°F | Ht 75.0 in | Wt 266.4 lb

## 2021-10-18 DIAGNOSIS — G4733 Obstructive sleep apnea (adult) (pediatric): Secondary | ICD-10-CM | POA: Insufficient documentation

## 2021-10-18 NOTE — Assessment & Plan Note (Addendum)
-   Patient has a history of moderate- severe sleep apnea.  Previously on CPAP, stopped wearing several years. He is following with cardiology for SVT. He continues to have symptoms of loud snoring and restless sleep. Epworth score 5/24. Needs split-night sleep study to re-evaluate OSA and titrate CPAP to optimal pressure setting.  Reviewed risks of untreated sleep apnea including cardiac arrhythmias, stroke, pulm hypertension or diabetes.  He is open to resuming CPAP if needed for treatment of OSA. Encourage patient work on weight loss efforts and focus on side sleeping position. Advised against driving if experiencing excessive daytime sleepiness or fatigue.  Follow-up in 3 months or sooner if needed.

## 2021-10-18 NOTE — Progress Notes (Signed)
$'@Patient'k$  ID: Samuel Mason, male    DOB: 10/23/1950, 71 y.o.   MRN: [id number]  Chief Complaint  Patient presents with   Sleep consult    Prior sleep study-C/o loud snoring, restless sleep and occ daytime sleepiness.     Referring provider: Deboraha Sprang, MD  HPI: 71 year old male, never smoked.  Past medical history significant for OSA, CAD, CVA, HTN, GERD, CKD stage 3, hyperlipidemia. Former patient of Dr. Ashby Dawes.   10/18/2021 Patient presents today for sleep consult. He is following with cardiology for paroxysmal SVT. HR today 50-60. He saw cardiology yesterday and they did EKG. He has symptoms of snoring and restless sleep. He has hx moderate-severe OSA, previously on CPAP. Unsure if he still has CPAP machine, has not been wearing for several years. Typical bedtime is around 1am. It takes him on average <5 mins to fall asleep. He wakes up several times a night. He starts his day at 10am. He tends to be more tired during the day in the summer. Weight is down 17 lbs. He is open to resuming CPAP if needed. Epworth 5/24. Denies symptoms of narcolepsy, cataplexy or sleep walking.   Sleep questionnaire Symptoms- loud snoring, apnea, restless sleep Prior sleep study- 12/17/2017, moderate-severe OSA Bedtime- 1am  Time to fall asleep- < 5 mins Nocturnal awakenings- 3-4 times Start day- 10am Do you operate heavy machinery- No Weight changes- 17 lbs down Do you wear CPAP- No Epworth-  5  **HST 08/20/2017 AHI 27.  Started AutoPap 8-15. **Review of outside sleep study report from 08/30/2007.  This was a CPAP titration, patient required CPAP at a level of 10 with an AHI reduced to 0.  In the study the patient had a central apnea index of as high as 6.--  Allergies  Allergen Reactions   Simvastatin     REACTION: Nausea    Immunization History  Administered Date(s) Administered   Influenza Inj Mdck Quad Pf 03/13/2018   Influenza, High Dose Seasonal PF 02/03/2017, 02/23/2018,  01/19/2019   PFIZER(Purple Top)SARS-COV-2 Vaccination 06/23/2019, 07/18/2019   Td 06/09/2002    Past Medical History:  Diagnosis Date   Asymptomatic PVCs    a. 07/2012 Holter: 7965 PVCs in 48 hrs; b. 07/2015 24hr Holter: 6000 PVCs (7%).   CAD (coronary artery disease)    a. 2009 Cath: nonobs dzs; b. 02/2010 Cath: LAD 15m D1 90, LCX 479mRCA min irregs, EF 60%-->Med Rx; c. 07/2012 Neg ETT; d. 05/2015 Neg MV. EF 73%.   Carotid arterial disease ([facility]   a. 11/2016 Carotid u/s: mild bilat carotid plaques.   CKD (chronic kidney disease), stage III ([facility]   a. Creat 1.67 05/22/2017.   GERD (gastroesophageal reflux disease)    a. noncompliant w/ PPI.   History of echocardiogram    a. 09/2016 Echo: EF 60-65%, no orwma, mild MR, mildly dil LA/RA.   Hyperlipidemia    Hypertension    Morbid obesity ([facility]   Obstructive sleep apnea    a. noncompliant w/ CPAP.   TIA (transient ischemic attack)     Tobacco History: Social History   Tobacco Use  Smoking Status Never  Smokeless Tobacco Never   Counseling given: Not Answered   Outpatient Medications Prior to Visit  Medication Sig Dispense Refill   amLODipine (NORVASC) 10 MG tablet Take 1.5 tablet daily     Ascorbic Acid (VITAMIN C PO) Take by mouth daily.     aspirin 81 MG tablet Take 81 mg by  mouth daily.     bisoprolol (ZEBETA) 5 MG tablet Take 1 tablet (5 mg total) by mouth daily. 30 tablet 0   CRESTOR 10 MG tablet Take 10 mg by mouth as needed.  3   eplerenone (INSPRA) 50 MG tablet Take 50 mg by mouth daily.     famotidine (PEPCID) 20 MG tablet Take by mouth.     losartan-hydrochlorothiazide (HYZAAR) 100-25 MG per tablet Take 1 tablet by mouth daily.     Multiple Vitamins-Minerals (MULTIVITAMIN ADULTS PO) Take 1 tablet by mouth daily.     nebivolol (BYSTOLIC) 5 MG tablet Take 1 tablet (5 mg total) by mouth daily. 30 tablet 0   Omega-3 Fatty Acids (FISH OIL PO) Take 350 mg by mouth daily.     No facility-administered medications prior  to visit.   Review of Systems  Review of Systems  Constitutional: Negative.  Negative for fatigue.  HENT: Negative.    Respiratory:  Positive for apnea.   Cardiovascular: Negative.   Psychiatric/Behavioral:  Positive for sleep disturbance.    Physical Exam  BP 110/70 (BP Location: Left Arm, Cuff Size: Large)   Pulse (!) 52   Temp 98 F (36.7 C) (Temporal)   Ht '6\' 3"'$  (1.905 m)   Wt 266 lb 6.4 oz (120.8 kg)   SpO2 99%   BMI 33.30 kg/m  Physical Exam Constitutional:      Appearance: Normal appearance.  HENT:     Head: Normocephalic and atraumatic.     Mouth/Throat:     Mouth: Mucous membranes are moist.     Pharynx: Oropharynx is clear.  Cardiovascular:     Rate and Rhythm: Normal rate and regular rhythm.  Pulmonary:     Effort: Pulmonary effort is normal.     Breath sounds: Normal breath sounds.  Musculoskeletal:     Cervical back: Normal range of motion and neck supple.  Skin:    General: Skin is warm and dry.  Neurological:     General: No focal deficit present.     Mental Status: He is alert and oriented to person, place, and time. Mental status is at baseline.  Psychiatric:        Mood and Affect: Mood normal.        Behavior: Behavior normal.        Thought Content: Thought content normal.        Judgment: Judgment normal.      Lab Results:  CBC No results found for: "WBC", "RBC", "HGB", "HCT", "PLT", "MCV", "MCH", "MCHC", "RDW", "LYMPHSABS", "MONOABS", "EOSABS", "BASOSABS"  BMET    Component Value Date/Time   NA 137 07/28/2017 1653   K 3.6 07/28/2017 1653   CL 105 07/28/2017 1653   CO2 24 07/28/2017 1653   GLUCOSE 93 07/28/2017 1653   BUN 25 (H) 07/28/2017 1653   CREATININE 1.86 (H) 07/28/2017 1653   CALCIUM 9.0 07/28/2017 1653   GFRNONAA 36 (L) 07/28/2017 1653   GFRAA 42 (L) 07/28/2017 1653    BNP No results found for: "BNP"  ProBNP No results found for: "PROBNP"  Imaging: ECHOCARDIOGRAM COMPLETE  Result Date: 10/04/2021     ECHOCARDIOGRAM REPORT   Patient Name:   [name] Date of Exam: 10/04/2021 Medical Rec #:  [id number]      Height:       75.0 in Accession #:    [id number]     Weight:       286.4 lb Date of Birth:  July 20, 1950  BSA:          2.556 m Patient Age:    40 years       BP:           120/90 mmHg Patient Gender: M              HR:           52 bpm. Exam Location:  [facility] Procedure: 2D Echo, Cardiac Doppler and Color Doppler Indications:    I49.3 PVC  History:        Patient has prior history of Echocardiogram examinations, most                 recent 09/23/2016. CAD, TIA and PAD, Arrythmias:PVC; Risk                 Factors:Sleep Apnea, Non-Smoker, Hypertension and Dyslipidemia.  Sonographer:    Pilar Jarvis RDMS, RVT, RDCS Referring Phys: [place]  1. Left ventricular ejection fraction, by estimation, is 60 to 65%. The left ventricle has normal function. The left ventricle has no regional wall motion abnormalities. There is mild left ventricular hypertrophy. Left ventricular diastolic parameters are indeterminate.  2. Right ventricular systolic function is normal. The right ventricular size is normal. There is normal pulmonary artery systolic pressure. The estimated right ventricular systolic pressure is 85.0 mmHg.  3. Left atrial size was mildly dilated.  4. The mitral valve is normal in structure. Mild to moderate mitral valve regurgitation. No evidence of mitral stenosis.  5. The aortic valve is normal in structure. Aortic valve regurgitation is not visualized. Aortic valve sclerosis is present, with no evidence of aortic valve stenosis.  6. The inferior vena cava is normal in size with greater than 50% respiratory variability, suggesting right atrial pressure of 3 mmHg. FINDINGS  Left Ventricle: Left ventricular ejection fraction, by estimation, is 60 to 65%. The left ventricle has normal function. The left ventricle has no regional wall motion abnormalities. The left ventricular  internal cavity size was normal in size. There is  mild left ventricular hypertrophy. Left ventricular diastolic parameters are indeterminate. Right Ventricle: The right ventricular size is normal. No increase in right ventricular wall thickness. Right ventricular systolic function is normal. There is normal pulmonary artery systolic pressure. The tricuspid regurgitant velocity is 1.82 m/s, and  with an assumed right atrial pressure of 5 mmHg, the estimated right ventricular systolic pressure is 27.7 mmHg. Left Atrium: Left atrial size was mildly dilated. Right Atrium: Right atrial size was normal in size. Pericardium: There is no evidence of pericardial effusion. Mitral Valve: The mitral valve is normal in structure. Mild to moderate mitral valve regurgitation. No evidence of mitral valve stenosis. Tricuspid Valve: The tricuspid valve is normal in structure. Tricuspid valve regurgitation is mild . No evidence of tricuspid stenosis. Aortic Valve: The aortic valve is normal in structure. Aortic valve regurgitation is not visualized. Aortic valve sclerosis is present, with no evidence of aortic valve stenosis. Aortic valve mean gradient measures 2.0 mmHg. Aortic valve peak gradient measures 4.4 mmHg. Aortic valve area, by VTI measures 3.50 cm. Pulmonic Valve: The pulmonic valve was normal in structure. Pulmonic valve regurgitation is not visualized. No evidence of pulmonic stenosis. Aorta: The aortic root is normal in size and structure. Venous: The inferior vena cava is normal in size with greater than 50% respiratory variability, suggesting right atrial pressure of 3 mmHg. IAS/Shunts: No atrial level shunt detected by color flow Doppler.  LEFT VENTRICLE  PLAX 2D LVIDd:         4.20 cm      Diastology LVIDs:         2.80 cm      LV e' medial:    8.05 cm/s LV PW:         1.00 cm      LV E/e' medial:  9.4 LV IVS:        1.10 cm      LV e' lateral:   10.90 cm/s LVOT diam:     2.20 cm      LV E/e' lateral: 7.0 LV SV:          80 LV SV Index:   31 LVOT Area:     3.80 cm  LV Volumes (MOD) LV vol d, MOD A2C: 115.0 ml LV vol d, MOD A4C: 125.0 ml LV vol s, MOD A2C: 52.7 ml LV vol s, MOD A4C: 59.6 ml LV SV MOD A2C:     62.3 ml LV SV MOD A4C:     125.0 ml LV SV MOD BP:      66.0 ml RIGHT VENTRICLE RV S prime:     11.90 cm/s RVOT diam:      3.25 cm TAPSE (M-mode): 2.1 cm LEFT ATRIUM             Index        RIGHT ATRIUM           Index LA diam:        3.90 cm 1.53 cm/m   RA Area:     20.30 cm LA Vol (A2C):   84.5 ml 33.06 ml/m  RA Volume:   57.60 ml  22.53 ml/m LA Vol (A4C):   69.1 ml 27.03 ml/m LA Biplane Vol: 78.2 ml 30.59 ml/m  AORTIC VALVE                    PULMONIC VALVE AV Area (Vmax):    3.50 cm     PV Vmax:       0.87 m/s AV Area (Vmean):   3.55 cm     PV Peak grad:  3.0 mmHg AV Area (VTI):     3.50 cm AV Vmax:           105.00 cm/s AV Vmean:          70.700 cm/s AV VTI:            0.229 m AV Peak Grad:      4.4 mmHg AV Mean Grad:      2.0 mmHg LVOT Vmax:         96.60 cm/s LVOT Vmean:        66.100 cm/s LVOT VTI:          0.211 m LVOT/AV VTI ratio: 0.92  AORTA Ao Root diam: 3.40 cm Ao Asc diam:  3.00 cm Ao Arch diam: 2.8 cm MITRAL VALVE               TRICUSPID VALVE MV Area (PHT): 3.76 cm    TR Peak grad:   13.2 mmHg MV Decel Time: 202 msec    TR Vmax:        182.00 cm/s MV E velocity: 75.80 cm/s MV A velocity: 42.00 cm/s  SHUNTS MV E/A ratio:  1.80        Systemic VTI:  0.21 m  Systemic Diam: 2.20 cm                            Pulmonic Diam: 3.25 cm Ida Rogue MD Electronically signed by Ida Rogue MD Signature Date/Time: 10/04/2021/5:07:36 PM    Final    LONG TERM MONITOR (3-14 DAYS)  Result Date: 10/03/2021 Patch Wear Time:  8 days and 2 hours (2023-05-31T13:17:37-0400 to 2023-06-08T16:05:18-0400) Patient had a min HR of 39 bpm, max HR of 171 bpm, and avg HR of 66 bpm. Predominant underlying rhythm was Sinus Rhythm. First Degree AV Block was present. 18 Ventricular Tachycardia runs  occurred, the run with the fastest interval lasting 5 beats with a max rate of 171 bpm, the longest lasting 8 beats with an avg rate of 137 bpm. 1534 Supraventricular Tachycardia runs occurred, the run with the fastest interval lasting 4 beats with a max rate of 167 bpm, the longest lasting 1 min 0 secs with an avg rate of 92 bpm. Supraventricular Tachycardia was detected within +/- 45 seconds of symptomatic patient event(s). Frequent PACs with a burden of 6%. Frequent PVCs with a burden of 6.1%.     Assessment & Plan:   OSA (obstructive sleep apnea) - Patient has a history of severe sleep apnea.  Previously on CPAP, stopped wearing several years. He is following with cardiology for SVT. He continues to have symptoms of loud snoring and restless sleep. Epworth score 5/24. Needs split-night sleep study to re-evaluate OSA and titrate CPAP to optimal pressure setting.  Reviewed risks of untreated sleep apnea including cardiac arrhythmias, stroke, pulm hypertension or diabetes.  He is open to resuming CPAP if needed for treatment of OSA. Encourage patient work on weight loss efforts and focus on side sleeping position. Advised against driving if experiencing excessive daytime sleepiness or fatigue.  Follow-up in 3 months or sooner if needed.   Martyn Ehrich, NP 10/18/2021

## 2021-10-18 NOTE — Patient Instructions (Signed)
You have a history of moderate to severe obstructive sleep apnea.  This puts you at increased risk for cardiac arrhythmias such as A-fib, stroke, pulmonary hypertension and diabetes.  Treatment options include weight loss, oral appliance, CPAP therapy or referral to ENT for possible surgical options/inspire device.  Recommend repeating in lab sleep study to reassess OSA and CPAP need/pressure settings  Work on weight loss efforts, ovate head of bed 30 degrees while sleeping, do not drive experiencing excessive daytime sleepiness or fatigue  Follow-up 3 months with [facility] NP or sooner if needed  Sleep Apnea Sleep apnea affects breathing during sleep. It causes breathing to stop for 10 seconds or more, or to become shallow. People with sleep apnea usually snore loudly. It can also increase the risk of: Heart attack. Stroke. Being very overweight (obese). Diabetes. Heart failure. Irregular heartbeat. High blood pressure. The goal of treatment is to help you breathe normally again. What are the causes?  The most common cause of this condition is a collapsed or blocked airway. There are three kinds of sleep apnea: Obstructive sleep apnea. This is caused by a blocked or collapsed airway. Central sleep apnea. This happens when the brain does not send the right signals to the muscles that control breathing. Mixed sleep apnea. This is a combination of obstructive and central sleep apnea. What increases the risk? Being overweight. Smoking. Having a small airway. Being older. Being male. Drinking alcohol. Taking medicines to calm yourself (sedatives or tranquilizers). Having family members with the condition. Having a tongue or tonsils that are larger than normal. What are the signs or symptoms? Trouble staying asleep. Loud snoring. Headaches in the morning. Waking up gasping. Dry mouth or sore throat in the morning. Being sleepy or tired during the day. If you are sleepy or tired  during the day, you may also: Not be able to focus your mind (concentrate). Forget things. Get angry a lot and have mood swings. Feel sad (depressed). Have changes in your personality. Have less interest in sex, if you are male. Be unable to have an erection, if you are male. How is this treated?  Sleeping on your side. Using a medicine to get rid of mucus in your nose (decongestant). Avoiding the use of alcohol, medicines to help you relax, or certain pain medicines (narcotics). Losing weight, if needed. Changing your diet. Quitting smoking. Using a machine to open your airway while you sleep, such as: An oral appliance. This is a mouthpiece that shifts your lower jaw forward. A CPAP device. This device blows air through a mask when you breathe out (exhale). An EPAP device. This has valves that you put in each nostril. A BIPAP device. This device blows air through a mask when you breathe in (inhale) and breathe out. Having surgery if other treatments do not work. Follow these instructions at home: Lifestyle Make changes that your doctor recommends. Eat a healthy diet. Lose weight if needed. Avoid alcohol, medicines to help you relax, and some pain medicines. Do not smoke or use any products that contain nicotine or tobacco. If you need help quitting, ask your doctor. General instructions Take over-the-counter and prescription medicines only as told by your doctor. If you were given a machine to use while you sleep, use it only as told by your doctor. If you are having surgery, make sure to tell your doctor you have sleep apnea. You may need to bring your device with you. Keep all follow-up visits. Contact a doctor if: The  machine that you were given to use during sleep bothers you or does not seem to be working. You do not get better. You get worse. Get help right away if: Your chest hurts. You have trouble breathing in enough air. You have an uncomfortable feeling in your  back, arms, or stomach. You have trouble talking. One side of your body feels weak. A part of your face is hanging down. These symptoms may be an emergency. Get help right away. Call your local emergency services (911 in the [place] Do not wait to see if the symptoms will go away. Do not drive yourself to the hospital. Summary This condition affects breathing during sleep. The most common cause is a collapsed or blocked airway. The goal of treatment is to help you breathe normally while you sleep. This information is not intended to replace advice given to you by your health care provider. Make sure you discuss any questions you have with your health care provider. Document Revised: 11/14/2020 Document Reviewed: 03/16/2020 Elsevier Patient Education  [place].

## 2021-10-20 NOTE — Progress Notes (Signed)
Reviewed and agree with assessment/plan.   Lukis Bunt, MD [facility] 10/20/2021, 10:57 AM Pager:  [phone]  

## 2021-10-24 ENCOUNTER — Telehealth: Payer: Self-pay

## 2021-10-24 DIAGNOSIS — I2510 Atherosclerotic heart disease of native coronary artery without angina pectoris: Secondary | ICD-10-CM

## 2021-10-24 NOTE — Telephone Encounter (Signed)
-----   Message from Wellington Hampshire, MD sent at 10/24/2021  1:15 PM EDT ----- Regarding: RE: [name],   Please schedule the patient for left heart catheterization possible PCI.  I can do next week.  Once the procedure is scheduled, I will call him and go over it with him.  ----- Message ----- From: Deboraha Sprang, MD Sent: 10/22/2021   5:33 PM EDT To: Wellington Hampshire, MD Subject: RE:                                            That would be great.  And I told him we might :). He was ok w that ----- Message ----- From: Wellington Hampshire, MD Sent: 10/22/2021   3:56 PM EDT To: Deboraha Sprang, MD  [name],  I reviewed the results of nuclear stress testing on this patient.  When I spoke with Dr. Rosario Jacks he was mostly concerned about the frequent PVCs more than the perfusion defect itself noted on nuclear stress testing.  However, I reviewed his previous cardiac catheterization which was in 2011 and at that time he had moderate mid LAD stenosis.  There is definitely possibility of progression of disease in the LAD.  I think proceeding with cardiac catheterization is reasonable especially if your considering antiarrhythmic medications.  Thanks.

## 2021-10-24 NOTE — Telephone Encounter (Signed)
Patients cardiac cath scheduled at [facility] on 11/01/21 @ 7:30am with Dr.Arida.  Spoke with the patient and made him aware of the scheduled procedure date and time. Adv the patient that Dr. Fletcher Anon will call him to review the procedure, discuss risk and update his H& P.  Pt sts that his family has planned an [place] for his 23 th birthday. He is scheduled to depart 7/18-7/29.  He really would like to be able to go on his trip as this has been on his bucket list for years.  Pt denies anginal symptoms. His main complaint is frequent PVCs associated with exertion.  Adv the patient that I will update Dr. Fletcher Anon who will call him tomorrow to discuss further.  Patient verbalized understanding.

## 2021-10-25 NOTE — Telephone Encounter (Signed)
I called the patient and discussed the indication for cardiac catheterization including mild exertional chest pain, abnormal nuclear stress test and frequent PVCs. He seems to have very mild anginal symptoms that have been stable for a long time.  He is able to exercise regularly and able to do 2 miles on the treadmill. I discussed cardiac cath procedure in details as well as risk and benefits. The patient is going on a cruise trip to [place] in the near future and prefers to have a catheterization done after he returns.  Given stability of his symptoms, I think that is reasonable. Please reschedule his cardiac catheterization from the 14th through July 31.

## 2021-10-29 NOTE — Telephone Encounter (Signed)
Cardiac cath rescheduled for 11/18/21 @ 9:30 am with Dr. Fletcher Anon at [facility].

## 2021-10-30 NOTE — Telephone Encounter (Signed)
Patient made aware of the procedure date, time, and location. Patient given verbal pre-procedure instructions. Pt will have pre-procedure labs (bmp, cbc[facility] Pt will stop by our office to pick up his written pre-procedure instructions tomorrow.

## 2021-10-31 ENCOUNTER — Other Ambulatory Visit
Admission: RE | Admit: 2021-10-31 | Discharge: 2021-10-31 | Disposition: A | Discharge status: Home / Self Care | Payer: Medicare Other | Attending: Cardiovascular Disease | Admitting: Cardiovascular Disease

## 2021-10-31 DIAGNOSIS — I2510 Atherosclerotic heart disease of native coronary artery without angina pectoris: Secondary | ICD-10-CM | POA: Insufficient documentation

## 2021-10-31 LAB — CBC WITH DIFFERENTIAL/PLATELET
Abs Immature Granulocytes: 0.02 10*3/uL (ref 0.00–0.07)
Basophils Absolute: 0 10*3/uL (ref 0.0–0.1)
Basophils Relative: 1 %
Eosinophils Absolute: 0.1 10*3/uL (ref 0.0–0.5)
Eosinophils Relative: 2 %
HCT: 42.2 % (ref 39.0–52.0)
Hemoglobin: 14.1 g/dL (ref 13.0–17.0)
Immature Granulocytes: 0 %
Lymphocytes Relative: 36 %
Lymphs Abs: 2.2 10*3/uL (ref 0.7–4.0)
MCH: 29.5 pg (ref 26.0–34.0)
MCHC: 33.4 g/dL (ref 30.0–36.0)
MCV: 88.3 fL (ref 80.0–100.0)
Monocytes Absolute: 0.5 10*3/uL (ref 0.1–1.0)
Monocytes Relative: 8 %
Neutro Abs: 3.3 10*3/uL (ref 1.7–7.7)
Neutrophils Relative %: 53 %
Platelets: 231 10*3/uL (ref 150–400)
RBC: 4.78 MIL/uL (ref 4.22–5.81)
RDW: 13.2 % (ref 11.5–15.5)
WBC: 6.1 10*3/uL (ref 4.0–10.5)
nRBC: 0 % (ref 0.0–0.2)

## 2021-10-31 LAB — BASIC METABOLIC PANEL
Anion gap: 7 (ref 5–15)
BUN: 24 mg/dL — ABNORMAL HIGH (ref 8–23)
CO2: 25 mmol/L (ref 22–32)
Calcium: 9.1 mg/dL (ref 8.9–10.3)
Chloride: 106 mmol/L (ref 98–111)
Creatinine, Ser: 1.85 mg/dL — ABNORMAL HIGH (ref 0.61–1.24)
GFR, Estimated: 39 mL/min — ABNORMAL LOW (ref >60)
Glucose, Bld: 95 mg/dL (ref 70–99)
Potassium: 4.1 mmol/L (ref 3.5–5.1)
Sodium: 138 mmol/L (ref 135–145)

## 2021-11-01 DIAGNOSIS — I2510 Atherosclerotic heart disease of native coronary artery without angina pectoris: Secondary | ICD-10-CM

## 2021-11-08 ENCOUNTER — Other Ambulatory Visit: Payer: Self-pay | Admitting: Internal Medicine

## 2021-11-14 ENCOUNTER — Telehealth: Payer: Self-pay | Admitting: Cardiovascular Disease

## 2021-11-14 NOTE — Telephone Encounter (Signed)
Pt's daughter is calling stating that they need to reschedule the procedure that is scheduled for 07/31 due to covid. Requesting call back.

## 2021-11-14 NOTE — Telephone Encounter (Signed)
No DPR on file for the patients daughter.  Called both telephone numbers listed for the patient and lmtcb.  Patients cardiac cath with Dr. Fletcher Anon scheduled on 11/18/21 has been cancelled as requested.  Potentially rescheduled for 11/27/21, pt will need to arrive at [facility] at [place].

## 2021-11-18 DIAGNOSIS — I2510 Atherosclerotic heart disease of native coronary artery without angina pectoris: Secondary | ICD-10-CM

## 2021-11-18 NOTE — Telephone Encounter (Signed)
Pt is calling back and is requesting return call to discuss appt.

## 2021-11-18 NOTE — Telephone Encounter (Signed)
Spoke with the patient. Patient sts that his COVID symptoms began on 11/15/21. He tested positive for COVID on on 11/17/21.  Pt cardiac cath is scheduled on 11/27/21 with Dr Fletcher Anon at [facility]. Pt adv to arrive at 8 am for 4 full hours of hydration. Adv the pt to hold hyzaar the morning of the procedure.  Adv the patient that per our protocol we can plan to proceed with the scheduled 11/27/21 cath. If pt symptoms have not improved he will call back on 11/25/21 to provide an update.

## 2021-11-27 ENCOUNTER — Other Ambulatory Visit: Payer: Self-pay

## 2021-11-27 ENCOUNTER — Encounter: Payer: Self-pay | Admitting: Cardiovascular Disease

## 2021-11-27 ENCOUNTER — Encounter
Admission: RE | Disposition: A | Discharge status: Home / Self Care | Payer: Self-pay | Source: Home / Self Care | Attending: Cardiovascular Disease

## 2021-11-27 ENCOUNTER — Ambulatory Visit
Admission: RE | Admit: 2021-11-27 | Discharge: 2021-11-27 | Disposition: A | Discharge status: Home / Self Care | Payer: Medicare Other | Attending: Cardiovascular Disease | Admitting: Cardiovascular Disease

## 2021-11-27 DIAGNOSIS — I471 Supraventricular tachycardia: Secondary | ICD-10-CM | POA: Diagnosis not present

## 2021-11-27 DIAGNOSIS — I2582 Chronic total occlusion of coronary artery: Secondary | ICD-10-CM | POA: Diagnosis not present

## 2021-11-27 DIAGNOSIS — Z79899 Other long term (current) drug therapy: Secondary | ICD-10-CM | POA: Diagnosis not present

## 2021-11-27 DIAGNOSIS — I129 Hypertensive chronic kidney disease with stage 1 through stage 4 chronic kidney disease, or unspecified chronic kidney disease: Secondary | ICD-10-CM | POA: Diagnosis not present

## 2021-11-27 DIAGNOSIS — I2510 Atherosclerotic heart disease of native coronary artery without angina pectoris: Secondary | ICD-10-CM

## 2021-11-27 DIAGNOSIS — I25118 Atherosclerotic heart disease of native coronary artery with other forms of angina pectoris: Secondary | ICD-10-CM | POA: Diagnosis present

## 2021-11-27 DIAGNOSIS — N183 Chronic kidney disease, stage 3 unspecified: Secondary | ICD-10-CM | POA: Insufficient documentation

## 2021-11-27 DIAGNOSIS — I2584 Coronary atherosclerosis due to calcified coronary lesion: Secondary | ICD-10-CM | POA: Insufficient documentation

## 2021-11-27 DIAGNOSIS — G4733 Obstructive sleep apnea (adult) (pediatric): Secondary | ICD-10-CM | POA: Insufficient documentation

## 2021-11-27 HISTORY — PX: LEFT HEART CATH AND CORONARY ANGIOGRAPHY: CATH118249

## 2021-11-27 SURGERY — LEFT HEART CATH AND CORONARY ANGIOGRAPHY
Anesthesia: Moderate Sedation

## 2021-11-27 MED ORDER — VERAPAMIL HCL 2.5 MG/ML IV SOLN
INTRAVENOUS | Status: DC | PRN
  Administered 2021-11-27: 2.5 mg via INTRA_ARTERIAL

## 2021-11-27 MED ORDER — LIDOCAINE HCL 1 % IJ SOLN
INTRAMUSCULAR | Status: AC
  Filled 2021-11-27: qty 20

## 2021-11-27 MED ORDER — FENTANYL CITRATE (PF) 100 MCG/2ML IJ SOLN
INTRAMUSCULAR | Status: AC
  Filled 2021-11-27: qty 2

## 2021-11-27 MED ORDER — LIDOCAINE HCL (PF) 1 % IJ SOLN
INTRAMUSCULAR | Status: DC | PRN
  Administered 2021-11-27: 2 mL

## 2021-11-27 MED ORDER — SODIUM CHLORIDE 0.9 % IV SOLN: INTRAVENOUS | Status: DC

## 2021-11-27 MED ORDER — SODIUM CHLORIDE 0.9% FLUSH
3.0000 mL | Freq: Two times a day (BID) | INTRAVENOUS | Status: DC
Start: 2021-11-27 — End: 2021-11-27

## 2021-11-27 MED ORDER — HEPARIN (PORCINE) IN NACL 1000-0.9 UT/500ML-% IV SOLN
INTRAVENOUS | Status: AC
  Filled 2021-11-27: qty 1000

## 2021-11-27 MED ORDER — IOHEXOL 300 MG/ML  SOLN
INTRAMUSCULAR | Status: DC | PRN
  Administered 2021-11-27: 36 mL

## 2021-11-27 MED ORDER — SODIUM CHLORIDE 0.9% FLUSH: 3.0000 mL | INTRAVENOUS | Status: DC | PRN

## 2021-11-27 MED ORDER — HEPARIN SODIUM (PORCINE) 1000 UNIT/ML IJ SOLN
INTRAMUSCULAR | Status: DC | PRN
  Administered 2021-11-27: 5000 [IU] via INTRAVENOUS

## 2021-11-27 MED ORDER — SODIUM CHLORIDE 0.9 % IV SOLN: 250.0000 mL | INTRAVENOUS | Status: DC | PRN

## 2021-11-27 MED ORDER — SODIUM CHLORIDE 0.9% FLUSH: 3.0000 mL | Freq: Two times a day (BID) | INTRAVENOUS | Status: DC

## 2021-11-27 MED ORDER — MIDAZOLAM HCL 2 MG/2ML IJ SOLN
INTRAMUSCULAR | Status: DC | PRN
  Administered 2021-11-27: 1 mg via INTRAVENOUS

## 2021-11-27 MED ORDER — HEPARIN SODIUM (PORCINE) 1000 UNIT/ML IJ SOLN
INTRAMUSCULAR | Status: AC
  Filled 2021-11-27: qty 10

## 2021-11-27 MED ORDER — ASPIRIN 81 MG PO CHEW
CHEWABLE_TABLET | ORAL | Status: AC
  Filled 2021-11-27: qty 1

## 2021-11-27 MED ORDER — HEPARIN (PORCINE) IN NACL 1000-0.9 UT/500ML-% IV SOLN
INTRAVENOUS | Status: DC | PRN
  Administered 2021-11-27 (×2): 500 mL

## 2021-11-27 MED ORDER — FENTANYL CITRATE (PF) 100 MCG/2ML IJ SOLN
INTRAMUSCULAR | Status: DC | PRN
  Administered 2021-11-27: 50 ug via INTRAVENOUS

## 2021-11-27 MED ORDER — VERAPAMIL HCL 2.5 MG/ML IV SOLN
INTRAVENOUS | Status: AC
  Filled 2021-11-27: qty 2

## 2021-11-27 MED ORDER — ACETAMINOPHEN 325 MG PO TABS: 650.0000 mg | ORAL_TABLET | ORAL | Status: DC | PRN

## 2021-11-27 MED ORDER — ONDANSETRON HCL 4 MG/2ML IJ SOLN: 4.0000 mg | Freq: Four times a day (QID) | INTRAMUSCULAR | Status: DC | PRN

## 2021-11-27 MED ORDER — ASPIRIN 81 MG PO CHEW
81.0000 mg | CHEWABLE_TABLET | ORAL | Status: AC
  Administered 2021-11-27: 81 mg via ORAL

## 2021-11-27 MED ORDER — MIDAZOLAM HCL 2 MG/2ML IJ SOLN
INTRAMUSCULAR | Status: AC
  Filled 2021-11-27: qty 2

## 2021-11-27 SURGICAL SUPPLY — 9 items
BAND CMPR LRG ZPHR (HEMOSTASIS) ×1
BAND ZEPHYR COMPRESS 30 LONG (HEMOSTASIS) ×1 IMPLANT
CATH INFINITI 5FR JK (CATHETERS) ×1 IMPLANT
DRAPE BRACHIAL (DRAPES) ×1 IMPLANT
GLIDESHEATH SLEND SS 6F .021 (SHEATH) ×1 IMPLANT
GUIDEWIRE INQWIRE 1.5J.035X260 (WIRE) IMPLANT
INQWIRE 1.5J .035X260CM (WIRE) ×2
PACK CARDIAC CATH (CUSTOM PROCEDURE TRAY) ×2 IMPLANT
SET ATX SIMPLICITY (MISCELLANEOUS) ×1 IMPLANT

## 2021-11-27 NOTE — H&P (Signed)
ELECTROPHYSIOLOGY CONSULT NOTE  Patient ID: Samuel Mason, MRN: [id number], DOB/AGE: 70-Jun-1952 70 y.o. Admit date: (Not on file) Date of Consult: 10/17/2021   Primary Physician: [name], MD Primary Cardiologist: MA     Samuel Mason is a 71 y.o. male who is being seen today for the evaluation of abnormal stress test at the request of MA.      HPI Samuel Mason is a 71 y.o. male complex and frequent ectopy.  These palpitations are most problematic at night when he goes to sleep particular lying on his side.  He notices him occasionally during the day but is not associated with shortness of breath or lightheadedness.   I had seen him in 2019 for PVCs associated with palpitations.  Treated with carvedilol with significant interval improvement.    Saw his PCP and underwent stress testing, the results of which are not available but because of ectopy  Dr. Gaylyn Cheers ordered a Zio patch.  Outlined below, showed multiple episodes of an atrial tachycardia as well as some episodes of nonsustained ventricular tachycardia     Hx of poorly controlled hypertension for which he was seen previously at [facility]  Untreated sleep apnea    DATE TEST EF    11/11 LHC   nonobs CAD  4/14 Myoview  73 % No ischemia  6/18 Echo  60-65%    3/19 Myoview   No ischemia  5/23 Myoview      6/23 Echo  60-65%      Date PVCs  4/14 7%  4/17 6%  3/19 9%--Multiple morphologies AIVR   6/23 6.1%        Date Cr K Hgb  3/23 1.72 3.8 13.9                       Past Medical History:  Diagnosis Date   Asymptomatic PVCs      a. 07/2012 Holter: 7965 PVCs in 48 hrs; b. 07/2015 24hr Holter: 6000 PVCs (7%).   CAD (coronary artery disease)      a. 2009 Cath: nonobs dzs; b. 02/2010 Cath: LAD 92m D1 90, LCX 473mRCA min irregs, EF 60%-->Med Rx; c. 07/2012 Neg ETT; d. 05/2015 Neg MV. EF 73%.   Carotid arterial disease ([facility]     a. 11/2016 Carotid u/s: mild bilat carotid plaques.   CKD (chronic kidney disease), stage III  ([place]     a. Creat 1.67 05/22/2017.   GERD (gastroesophageal reflux disease)      a. noncompliant w/ PPI.   History of echocardiogram      a. 09/2016 Echo: EF 60-65%, no orwma, mild MR, mildly dil LA/RA.   Hyperlipidemia     Hypertension     Morbid obesity ([facility]    Obstructive sleep apnea      a. noncompliant w/ CPAP.   TIA (transient ischemic attack)         Surgical History:       Past Surgical History:  Procedure Laterality Date   CARDIAC CATHETERIZATION   2011    50% mid LAD stenosis, 40% proximal LCX, EF 60%   CARDIAC CATHETERIZATION   2009    Mild CAD             COLONOSCOPY WITH PROPOFOL N/A 10/19/2018    Procedure: COLONOSCOPY WITH PROPOFOL;  Surgeon: AnJonathon BellowsMD;  [facility];  Service: Gastroenterology;  Laterality: N/A;      Home Meds: Active Medications  Current Meds  Medication Sig   amLODipine (NORVASC) 10 MG tablet Take 1.5 tablet daily   Ascorbic Acid (VITAMIN C PO) Take by mouth daily.   aspirin 81 MG tablet Take 81 mg by mouth daily.   carvedilol (COREG) 6.25 MG tablet Take 6.25 mg by mouth 3 (three) times daily.    CRESTOR 10 MG tablet Take 10 mg by mouth as needed.   eplerenone (INSPRA) 50 MG tablet Take 50 mg by mouth daily.   famotidine (PEPCID) 20 MG tablet Take by mouth.   losartan-hydrochlorothiazide (HYZAAR) 100-25 MG per tablet Take 1 tablet by mouth daily.   Multiple Vitamins-Minerals (MULTIVITAMIN ADULTS PO) Take 1 tablet by mouth daily.   Omega-3 Fatty Acids (FISH OIL PO) Take 350 mg by mouth daily.        Allergies:       Allergies  Allergen Reactions   Simvastatin        REACTION: Nausea      Social History         Socioeconomic History   Marital status: Married      Spouse name: Not on file   Number of children: Not on file   Years of education: Not on file   Highest education level: Not on file  Occupational History   Not on file  Tobacco Use   Smoking status: Never   Smokeless tobacco: Never   Vaping Use   Vaping Use: Never used  Substance and Sexual Activity   Alcohol use: No      Alcohol/week: 0.0 standard drinks of alcohol      Comment: socail drinker   Drug use: No   Sexual activity: Not on file  Other Topics Concern   Not on file  Social History Narrative   Not on file    Social Determinants of Health    Financial Resource Strain: Not on file  Food Insecurity: Not on file  Transportation Needs: Not on file  Physical Activity: Not on file  Stress: Not on file  Social Connections: Not on file  Intimate Partner Violence: Not on file           Family History  Problem Relation Age of Onset   Heart attack Father          MI      ROS:  Please see the history of present illness.     All other systems reviewed and negative.      Physical Exam: Blood pressure 128/80, pulse 69, height '6\' 3"'$  (1.905 m), weight 265 lb (120.2 kg). General: Well developed, well nourished male in no acute distress. Head: Normocephalic, atraumatic, sclera non-icteric, no xanthomas, nares are without discharge. EENT: normal  Lymph Nodes:  none Neck: Negative for carotid bruits. JVD not elevated. Back:without scoliosis kyphosis Lungs: Clear bilaterally to auscultation without wheezes, rales, or rhonchi. Breathing is unlabored. Heart: RRR with S1 S2. No  murmur . No rubs, or gallops appreciated. Abdomen: Soft, non-tender, non-distended with normoactive bowel sounds. No hepatomegaly. No rebound/guarding. No obvious abdominal masses. Msk:  Strength and tone appear normal for age. Extremities: No clubbing or cyanosis. No  edema.  Distal pedal pulses are 2+ and equal bilaterally. Skin: Warm and Dry Neuro: Alert and oriented X 3. CN III-XII intact Grossly normal sensory and motor function . Psych:  Responds to questions appropriately with a normal affect.                     EKG: sinus @  69 20/10/42   PACS and OP     Assessment and Plan:    AIVR   Atrial tachycardia   PVCs    Low voltage   Sinus bradycardia (relative)   Coronary disease nonobstructive   Chest pain-atypical   Sleep apnea-untreated   Hypertension-poorly controlled   Will change carvedilol to bisoprolol and nebivolol to see thhe former is contributing to fatigue   Palpitations are worse at night, but by histogram data, they are fairly constant I suspect it is related to the quietness of the evening and trying to be supine.  We discussed lying on his right side.  We also discussed the potential role of a class Ic drug.  However, to do this we would need to clarify that he does not have obstructive coronary disease; in this vein, we were able to obtain a report of his Myoview scan from 2023.  It describes a anterior wall perfusion defect and this in contradistinction to normal perfusion studies years before.  He has had chest pains for some years.  He is able to push through them.  I will ask Dr. Audelia Acton to review this with him next week and they can make a decision at that time as to whether to proceed with catheterization for augmented medical therapy.   Have encouraged him to follow-up with pulmonary to reestablish CPAP therapy.       Virl Axe    Addendum on November 27, 2021: The patient has known frequent PVCs with chronic atypical chest pain on previous nonobstructive coronary artery disease on cardiac catheterization.  He had increased frequency of PVCs recently and was seen by Dr. Caryl Comes with plans to possibly use class Ic antiarrhythmic medications.  However, there was concern about his chest pain, abnormal nuclear stress test and possible underlying coronary artery disease.  Thus, left heart catheterization was recommended.  By physical exam, heart is regular with premature beats but no murmurs.  Lungs are clear to auscultation with no significant leg edema.  Right radial pulses normal.  I discussed the left heart catheterization procedure with the patient.  No further questions.

## 2021-11-27 NOTE — Progress Notes (Signed)
Dr. Fletcher Anon at bedside, speaking with pt. And his wife Shirlean Mylar re: cath results. Both verbalized understanding of conversation post cath.

## 2021-11-28 ENCOUNTER — Encounter: Payer: Self-pay | Admitting: Cardiovascular Disease

## 2021-12-09 ENCOUNTER — Ambulatory Visit: Payer: Medicare Other | Attending: Pulmonary Disease

## 2021-12-09 DIAGNOSIS — G4733 Obstructive sleep apnea (adult) (pediatric): Secondary | ICD-10-CM | POA: Insufficient documentation

## 2021-12-09 DIAGNOSIS — G4737 Central sleep apnea in conditions classified elsewhere: Secondary | ICD-10-CM | POA: Insufficient documentation

## 2021-12-11 ENCOUNTER — Other Ambulatory Visit: Payer: Self-pay | Admitting: Cardiovascular Disease

## 2021-12-11 ENCOUNTER — Telehealth: Payer: Self-pay

## 2021-12-11 NOTE — Telephone Encounter (Signed)
Received refill request from pharmacy for bisoprolol. Called and spoke with patient to find out which beta blocker that Dr. Caryl Comes prescribed worked best for him. He states he does not need a refill at this time because he has not tried either due to some other medical issues he has been having. He states he will try them and call us next week with his decision. Refill request by pharmacy denied at this time.

## 2021-12-12 ENCOUNTER — Telehealth (INDEPENDENT_AMBULATORY_CARE_PROVIDER_SITE_OTHER): Payer: Medicare Other | Admitting: Pulmonary Disease

## 2021-12-12 DIAGNOSIS — G4733 Obstructive sleep apnea (adult) (pediatric): Secondary | ICD-10-CM | POA: Diagnosis not present

## 2021-12-12 NOTE — Telephone Encounter (Signed)
PSG showed severe OSA -44/hour but also quite a few central apneas, central apnea index was 12/hour Recommend PAP titration study. Since he has significant central apneas, auto CPAP may not be recommended

## 2021-12-13 NOTE — Telephone Encounter (Signed)
Called and spoke with pt's spouse and have moved pt's appt up. Nothing further needed.

## 2021-12-13 NOTE — Telephone Encounter (Signed)
Can you see if patient can get sooner apt with me to discuss sleep study and next steps

## 2021-12-24 ENCOUNTER — Telehealth (INDEPENDENT_AMBULATORY_CARE_PROVIDER_SITE_OTHER): Payer: Medicare Other | Admitting: Primary Care

## 2021-12-24 ENCOUNTER — Encounter: Payer: Self-pay | Admitting: Primary Care

## 2021-12-24 VITALS — BP 122/86

## 2021-12-24 DIAGNOSIS — G4733 Obstructive sleep apnea (adult) (pediatric): Secondary | ICD-10-CM | POA: Diagnosis not present

## 2021-12-24 NOTE — Progress Notes (Signed)
Reviewed and agree with assessment/plan.   Chesley Mires, MD [facility] 12/24/2021, 12:44 PM Pager:  [phone]

## 2021-12-24 NOTE — Progress Notes (Signed)
Virtual Visit via Video Note  I connected with Samuel Mason on 12/24/21 at 12:00 PM EDT by a video enabled telemedicine application and verified that I am speaking with the correct person using two identifiers.  Location: Patient: Home Provider: Office    I discussed the limitations of evaluation and management by telemedicine and the availability of in person appointments. The patient expressed understanding and agreed to proceed.  History of Present Illness: 71 year old male, never smoked.  Past medical history significant for OSA, CAD, CVA, HTN, GERD, CKD stage 3, hyperlipidemia. Former patient of Dr. Ashby Dawes.   Previous [facility] pulmonary encounter: 10/18/2021 Patient presents today for sleep consult. He is following with cardiology for paroxysmal SVT. HR today 50-60. He saw cardiology yesterday and they did EKG. He has symptoms of snoring and restless sleep. He has hx moderate-severe OSA, previously on CPAP. Unsure if he still has CPAP machine, has not been wearing for several years. Typical bedtime is around 1am. It takes him on average <5 mins to fall asleep. He wakes up several times a night. He starts his day at 10am. He tends to be more tired during the day in the summer. Weight is down 17 lbs. He is open to resuming CPAP if needed. Epworth 5/24. Denies symptoms of narcolepsy, cataplexy or sleep walking.   Sleep questionnaire Symptoms- loud snoring, apnea, restless sleep Prior sleep study- **HST 08/20/2017 AHI 27.  Started AutoPap 8-15. Bedtime- 1am  Time to fall asleep- < 5 mins Nocturnal awakenings- 3-4 times Start day- 10am Do you operate heavy machinery- No Weight changes- 17 lbs down Do you wear CPAP- No Epworth-  5   12/24/2021- Interim hx  Patient contacted today to review sleep study results.  Patient had PSG on 12/10/2021 that showed evidence of severe obstructive sleep apnea, AHI 41.9/hr. central apnea index was 12/hour. Reviewed sleep study results with patient and  treatment options.  He is open with starting CPAP and understands that he will need to return to the lab for a Pap titration study.  He had some questions about inspire device which we discussed in today.  If patient is unable to tolerate CPAP or BiPAP this may be an option for him.    Observations/Objective:  Appears well without over respiratory symptoms   Assessment and Plan:  OSA: -Patient has symptoms snoring, witnessed apnea and daytime sleepiness  -PSG on 12/10/2021 showed severe OSA, AHI 41.9/hr (cental apnea index 12/hr) -Recommending Pap titration study due to central apneas, patient in agreement with plan -Encourage patient focus on side sleeping position or elevate head of bed 30 degrees.  Advised against driving if experiencing excessive daytime sleepiness or fatigue.  Avoid alcohol or sedating medications prior to bedtime.   Follow Up Instructions:  -After starting PAP therapy for compliance check  I discussed the assessment and treatment plan with the patient. The patient was provided an opportunity to ask questions and all were answered. The patient agreed with the plan and demonstrated an understanding of the instructions.   The patient was advised to call back or seek an in-person evaluation if the symptoms worsen or if the condition fails to improve as anticipated.  I provided 22 minutes of non-face-to-face time during this encounter.   Martyn Ehrich, NP

## 2022-01-07 ENCOUNTER — Ambulatory Visit: Payer: Medicare Other | Admitting: Primary Care

## 2022-01-07 ENCOUNTER — Encounter: Payer: Medicare Other | Admitting: Pulmonary Disease

## 2022-01-09 ENCOUNTER — Ambulatory Visit (HOSPITAL_BASED_OUTPATIENT_CLINIC_OR_DEPARTMENT_OTHER): Payer: Medicare Other | Attending: Primary Care | Admitting: Pulmonary Disease

## 2022-01-09 ENCOUNTER — Ambulatory Visit: Payer: Medicare Other | Admitting: Medical

## 2022-01-09 DIAGNOSIS — G4733 Obstructive sleep apnea (adult) (pediatric): Secondary | ICD-10-CM | POA: Diagnosis present

## 2022-01-09 DIAGNOSIS — G4731 Primary central sleep apnea: Secondary | ICD-10-CM | POA: Insufficient documentation

## 2022-01-09 DIAGNOSIS — I493 Ventricular premature depolarization: Secondary | ICD-10-CM | POA: Diagnosis not present

## 2022-01-10 ENCOUNTER — Other Ambulatory Visit: Payer: Self-pay | Admitting: Internal Medicine

## 2022-01-10 DIAGNOSIS — G4733 Obstructive sleep apnea (adult) (pediatric): Secondary | ICD-10-CM

## 2022-01-10 NOTE — Procedures (Signed)
Patient Name: Samuel Mason, Samuel Mason Date: 01/09/2022 Gender: Male D.O.B: 1950-05-20 Age (years): 74 Referring Provider: Geraldo Pitter NP Height (inches): 74 Interpreting Physician: Kara Mead MD, ABSM Weight (lbs): 265 RPSGT: Jorge Ny BMI: 34 MRN: [id number] Neck Size: 15.50 <br> <br> CLINICAL INFORMATION The patient is referred for a PAP titration to treat sleep apnea.    NPSG 11/2021 showed severe OSA -44/hour but also quite a few central apneas, central apnea index was 12/hour SLEEP STUDY TECHNIQUE As per the AASM Manual for the Scoring of Sleep and Associated Events v2.3 (April 2016) with a hypopnea requiring 4% desaturations.  The channels recorded and monitored were frontal, central and occipital EEG, electrooculogram (EOG), submentalis EMG (chin), nasal and oral airflow, thoracic and abdominal wall motion, anterior tibialis EMG, snore microphone, electrocardiogram, and pulse oximetry. Bilevel positive airway pressure (BPAP) was initiated at the beginning of the study and titrated to treat sleep-disordered breathing.  MEDICATIONS Medications self-administered by patient taken the night of the study : Eplerenon, NORVASC  RESPIRATORY PARAMETERS Optimal IPAP Pressure (cm): 14 AHI at Optimal Pressure (/hr) 0 Optimal EPAP Pressure (cm): 10   Overall Minimal O2 (%): 79.0 Minimal O2 at Optimal Pressure (%): 96.0 SLEEP ARCHITECTURE Start Time: 11:15:26 PM Stop Time: 5:18:44 AM Total Time (min): 363.3 Total Sleep Time (min): 174.5 Sleep Latency (min): 42.4 Sleep Efficiency (%): 48.0% REM Latency (min): 42.0 WASO (min): 146.4 Stage N1 (%): 5.2% Stage N2 (%): 72.2% Stage N3 (%): 0.0% Stage R (%): 22.6 Supine (%): 0.00 Arousal Index (/hr): 16.8     CARDIAC DATA The 2 lead EKG demonstrated sinus rhythm. The mean heart rate was 51.2 beats per minute. Other EKG findings include: PVCs.   LEG MOVEMENT DATA The total Periodic Limb Movements of Sleep (PLMS) were 0. The PLMS  index was 0.0. A PLMS index of <15 is considered normal in adults.  IMPRESSIONS - An optimal biPAP pressure was selected for this patient ( 14 / 10 cm of water) - Moderate Central Sleep Apnea was noted during this titration (CAI = 19.3/h). - Severe oxygen desaturations were observed during this titration (min O2 = 79.0%). - The patient snored with moderate snoring volume. - 2-lead EKG demonstrated: PVCs - Clinically significant periodic limb movements were not noted during this study. Arousals associated with PLMs were rare.   DIAGNOSIS - Obstructive Sleep Apnea (G47.33) - Central sleep apnea (present on baseline study)   RECOMMENDATIONS - Trial of BiPAP therapy on 14/10 cm H2O with a Medium size Fisher&Paykel Full Face Simplus mask and heated humidification. CPAP was inadequate due to central apneas & high pressure required - Avoid alcohol, sedatives and other CNS depressants that may worsen sleep apnea and disrupt normal sleep architecture. - Sleep hygiene should be reviewed to assess factors that may improve sleep quality. - Weight management and regular exercise should be initiated or continued. - Return to Sleep Center for re-evaluation after 4 weeks of therapy   Kara Mead MD Board Certified in [place]

## 2022-01-13 ENCOUNTER — Telehealth: Payer: Self-pay | Admitting: Primary Care

## 2022-01-13 DIAGNOSIS — G4733 Obstructive sleep apnea (adult) (pediatric): Secondary | ICD-10-CM

## 2022-01-13 NOTE — Telephone Encounter (Signed)
CPAP titration study completed on 01/09/2022; He had moderate central sleep apnea as well as severe oxygen desaturations.  BiPAP pressure selected for patient was 14/10 cm h20.    We will place an order for patient to be started on BiPAP therapy 14/10 with medium size Fisher and Paykel full face Simplus mask with heated humidification.  Needs follow-up in 4 to 6 weeks after starting BiPAP treatment. Recalled placed.

## 2022-01-16 ENCOUNTER — Telehealth: Payer: Self-pay | Admitting: Internal Medicine

## 2022-01-16 NOTE — Telephone Encounter (Signed)
I spoke with the patient and his wife.  The patient had been taking bystolic 5 mg once daily as prescribed by Dr. Caryl Comes on 10/17/21.  In the last 1.5 weeks, he has increased the dose to take: Bystolic 5 mg- 1.5 tablets (7.5 mg) by mouth once daily  He advised that he was waking up with headaches and thought it was due to his BP. BP today is 118/79- no other readings were provided by the patient or his wife, but they feel the other readings have been similar to this since the patient increased the dose of Bystolic on his own.   He denies and palpitations, but advised he underwent a recent repeat sleep study and was told most of his "this" was occurring at night. He is being changed from a C-PAP to a Bi-PAP per his report.    I have advised the patient I will need to get Dr. Olin Pia ok to change the sig on his RX.  He is aware we will send this in once ok'ed by the MD.  The patient voices understanding and is agreeable.

## 2022-01-16 NOTE — Telephone Encounter (Signed)
Pt c/o medication issue:  1. Name of Medication:   nebivolol (BYSTOLIC) 5 MG tablet    2. How are you currently taking this medication (dosage and times per day)? TAKE 1 TABLET BY MOUTH DAILY  3. Are you having a reaction (difficulty breathing--STAT)? No  4. What is your medication issue? Pt states that he has been taking 1 1/2 tablets. He says that it seems to be helping with his BP. Pt would like to know if dosage can be changed and sent over to pharmacy due to him only having 3 tablets left. Please advise

## 2022-01-20 NOTE — Telephone Encounter (Signed)
Ok to increase bisporolol

## 2022-01-21 MED ORDER — NEBIVOLOL HCL 5 MG PO TABS: 7.5000 mg | ORAL_TABLET | Freq: Every day | ORAL | 2 refills | Status: DC

## 2022-01-21 NOTE — Telephone Encounter (Signed)
I called and spoke with the patient's wife and advised Dr. Caryl Comes has ok'ed the dose change for his bystolic to 5 mg- 1.5 tablets once daily.  I have clarified with her that she would like this sent to [facility] on [facility].

## 2022-01-23 ENCOUNTER — Ambulatory Visit (INDEPENDENT_AMBULATORY_CARE_PROVIDER_SITE_OTHER): Payer: Medicare Other | Admitting: Podiatry

## 2022-01-23 ENCOUNTER — Encounter: Payer: Self-pay | Admitting: Podiatry

## 2022-01-23 DIAGNOSIS — B351 Tinea unguium: Secondary | ICD-10-CM

## 2022-01-23 DIAGNOSIS — I739 Peripheral vascular disease, unspecified: Secondary | ICD-10-CM

## 2022-01-23 DIAGNOSIS — M79674 Pain in right toe(s): Secondary | ICD-10-CM | POA: Diagnosis not present

## 2022-01-23 DIAGNOSIS — M79675 Pain in left toe(s): Secondary | ICD-10-CM | POA: Diagnosis not present

## 2022-01-23 DIAGNOSIS — N183 Chronic kidney disease, stage 3 unspecified: Secondary | ICD-10-CM

## 2022-01-23 NOTE — Progress Notes (Signed)

## 2022-01-28 ENCOUNTER — Ambulatory Visit: Payer: Medicare Other | Attending: Cardiovascular Disease | Admitting: Cardiovascular Disease

## 2022-01-28 ENCOUNTER — Encounter: Payer: Self-pay | Admitting: Cardiovascular Disease

## 2022-01-28 VITALS — BP 110/78 | HR 61 | Ht 75.0 in | Wt 273.2 lb

## 2022-01-28 DIAGNOSIS — I10 Essential (primary) hypertension: Secondary | ICD-10-CM | POA: Diagnosis present

## 2022-01-28 DIAGNOSIS — E785 Hyperlipidemia, unspecified: Secondary | ICD-10-CM | POA: Diagnosis not present

## 2022-01-28 DIAGNOSIS — I493 Ventricular premature depolarization: Secondary | ICD-10-CM | POA: Insufficient documentation

## 2022-01-28 DIAGNOSIS — I2510 Atherosclerotic heart disease of native coronary artery without angina pectoris: Secondary | ICD-10-CM | POA: Diagnosis not present

## 2022-01-28 MED ORDER — AMLODIPINE BESYLATE 10 MG PO TABS: 10.0000 mg | ORAL_TABLET | Freq: Every day | ORAL | 3 refills | Status: DC

## 2022-01-28 NOTE — Patient Instructions (Signed)
Medication Instructions:  Your physician has recommended you make the following change in your medication: DECREASE: Amlodipine '10mg'$  daily.  *If you need a refill on your cardiac medications before your next appointment, please call your pharmacy*   Lab Work: NONE ordered at this time of appointment  If you have labs (blood work) drawn today and your tests are completely normal, you will receive your results only by: [place] (if you have MyChart) OR A paper copy in the mail If you have any lab test that is abnormal or we need to change your treatment, we will call you to review the results.   Testing/Procedures: NONE ordered at this time of appointment   Follow-Up: At [facility], you and your health needs are our priority.  As part of our continuing mission to provide you with exceptional heart care, we have created designated Provider Care Teams.  These Care Teams include your primary Cardiologist (physician) and Advanced Practice Providers (APPs -  Physician Assistants and Nurse Practitioners) who all work together to provide you with the care you need, when you need it.  We recommend signing up for the patient portal called "MyChart".  Sign up information is provided on this After Visit Summary.  MyChart is used to connect with patients for Virtual Visits (Telemedicine).  Patients are able to view lab/test results, encounter notes, upcoming appointments, etc.  Non-urgent messages can be sent to your provider as well.   To learn more about what you can do with MyChart, go to [email or web address].    Your next appointment:   6 month(s)  The format for your next appointment:   In Person  Provider:   [name], MD    Important Information About Sugar

## 2022-01-28 NOTE — Progress Notes (Signed)
Cardiology Office Note   Date:  01/28/2022   ID:  Samuel Mason, DOB 1950/06/18, MRN [id number]  PCP:  [name], MD  Cardiologist:   [name], MD   Chief Complaint  Patient presents with   Other    3 month f/u c/o of Edema with Bystolic discuss carvedilol. Meds reviewed verbally with pt.      History of Present Illness: Samuel Mason is a 71 y.o. male who presents for a followup visit regarding hypertension, coronary artery disease and frequent asymptomatic PVCs.   He is known to have refractory hypertension with no evidence of renal artery stenosis. He also suffers from chronic chest pain likely musculoskeletal with cardiac catheterization done twice in 2009 and 2011. Both times there was mild to moderate CAD without evidence of obstructive disease. He also has known history of sleep apnea did not tolerate CPAP well. He was first noted to have frequent PVCs years ago during his sleep study and has been treated medically with a beta blocker.  He was seen by Dr. Caryl Comes recently for increased PVCs.  Carvedilol was switched to Bystolic.  The patient also reported increased fatigue and some shortness of breath and there was consideration for using class Ic medications.  Thus, he underwent left heart catheterization by me which showed stable moderate mid LAD stenosis and significant stenosis in the mid right coronary artery which was small and nondominant.  Also OM 3 was noted to be occluded with collaterals.   He underwent a sleep study which confirmed obstructive and central sleep apnea.  He is in the process of getting CPAP.  He denies chest pain.  Biggest complaint continues to be fatigue and also he noticed increased lower extremity edema at the end of the day after he walks.    Past Medical History:  Diagnosis Date   Asymptomatic PVCs    a. 07/2012 Holter: 7965 PVCs in 48 hrs; b. 07/2015 24hr Holter: 6000 PVCs (7%).   CAD (coronary artery disease)    a. 2009 Cath:  nonobs dzs; b. 02/2010 Cath: LAD 71m D1 90, LCX 479mRCA min irregs, EF 60%-->Med Rx; c. 07/2012 Neg ETT; d. 05/2015 Neg MV. EF 73%.   Carotid arterial disease ([facility]   a. 11/2016 Carotid u/s: mild bilat carotid plaques.   CKD (chronic kidney disease), stage III ([facility]   a. Creat 1.67 05/22/2017.   GERD (gastroesophageal reflux disease)    a. noncompliant w/ PPI.   History of echocardiogram    a. 09/2016 Echo: EF 60-65%, no orwma, mild MR, mildly dil LA/RA.   Hyperlipidemia    Hypertension    Morbid obesity ([place]   Obstructive sleep apnea    a. noncompliant w/ CPAP.   TIA (transient ischemic attack)     Past Surgical History:  Procedure Laterality Date   CARDIAC CATHETERIZATION  2011   50% mid LAD stenosis, 40% proximal LCX, EF 60%   CARDIAC CATHETERIZATION  2009   Mild CAD             COLONOSCOPY WITH PROPOFOL N/A 10/19/2018   Procedure: COLONOSCOPY WITH PROPOFOL;  Surgeon: AnJonathon BellowsMD;  [facility];  Service: Gastroenterology;  Laterality: N/A;   LEFT HEART CATH AND CORONARY ANGIOGRAPHY N/A 11/27/2021   Procedure: LEFT HEART CATH AND CORONARY ANGIOGRAPHY with poss PCI;  Surgeon: ArWellington HampshireMD;  [facility];  Service: Cardiovascular;  Laterality: N/A;     Current Outpatient  Medications  Medication Sig Dispense Refill   amLODipine (NORVASC) 10 MG tablet Take 1.5 tablet daily     Ascorbic Acid (VITAMIN C PO) Take by mouth daily.     aspirin 81 MG tablet Take 81 mg by mouth daily.     CRESTOR 10 MG tablet Take 10 mg by mouth as needed.  3   eplerenone (INSPRA) 50 MG tablet Take 50 mg by mouth daily.     famotidine (PEPCID) 20 MG tablet Take by mouth.     losartan-hydrochlorothiazide (HYZAAR) 100-25 MG per tablet Take 1 tablet by mouth daily.     Multiple Vitamins-Minerals (MULTIVITAMIN ADULTS PO) Take 1 tablet by mouth daily.     nebivolol (BYSTOLIC) 5 MG tablet Take 1.5 tablets (7.5 mg total) by mouth daily. 90 tablet 2   Omega-3 Fatty  Acids (FISH OIL PO) Take 350 mg by mouth daily.     No current facility-administered medications for this visit.    Allergies:   Simvastatin    Social History:  The patient  reports that he has never smoked. He has never used smokeless tobacco. He reports that he does not drink alcohol and does not use drugs.   Family History:  The patient's family history includes Heart attack in his father.    ROS:  Please see the history of present illness.   Otherwise, review of systems are positive for none.   All other systems are reviewed and negative.    PHYSICAL EXAM: VS:  BP 110/78 (BP Location: Left Arm, Patient Position: Sitting, Cuff Size: Normal)   Pulse 61   Ht '6\' 3"'$  (1.905 m)   Wt 273 lb 4 oz (123.9 kg)   SpO2 98%   BMI 34.15 kg/m  , BMI Body mass index is 34.15 kg/m. GEN: Well nourished, well developed, in no acute distress  HEENT: normal  Neck: no JVD, carotid bruits, or masses Cardiac: RRR with premature beats; no murmurs, rubs, or gallops,no edema  Respiratory:  clear to auscultation bilaterally, normal work of breathing GI: soft, nontender, nondistended, + BS MS: no deformity or atrophy  Skin: warm and dry, no rash Neuro:  Strength and sensation are intact Psych: euthymic mood, full affect Right radial pulses normal with no hematoma.   EKG:  EKG is ordered today. The ekg ordered today demonstrates sinus bradycardia with first-degree AV block with occasional PVCs.   Recent Labs: 10/31/2021: BUN 24; Creatinine, Ser 1.85; Hemoglobin 14.1; Platelets 231; Potassium 4.1; Sodium 138    Lipid Panel    Component Value Date/Time   CHOL 234 (HH) 06/22/2006 1553   TRIG 91 06/22/2006 1553   HDL 36.4 (L) 06/22/2006 1553   CHOLHDL 6.4 CALC 06/22/2006 1553   VLDL 18 06/22/2006 1553   LDLDIRECT 183.4 06/22/2006 1553      Wt Readings from Last 3 Encounters:  01/28/22 273 lb 4 oz (123.9 kg)  01/09/22 265 lb (120.2 kg)  11/27/21 278 lb (126.1 kg)         ASSESSMENT  AND PLAN:  1. Asymptomatic PVCs: These seem to be improved with Bystolic.  I suspect that his ventricular arrhythmia at night is likely related to untreated sleep apnea.  2. Refractory hypertension: Blood pressure is well controlled on current medications.  Given increased lower extremity edema, I decreased amlodipine to 10 mg daily.  3. Hyperlipidemia: Continue treatment with rosuvastatin with a target LDL of less than 70. This is followed by Dr. Rosario Jacks.   4.  Chronic kidney disease:  Most recent creatinine was 1.8.  5.  Coronary artery disease involving native coronary arteries without angina: Recent cardiac catheterization showed moderate LAD stenosis as well as small vessel disease involving OM branch and a small nondominant right coronary artery.  As such avoid class Ic antiarrhythmic medications.  Recommend aggressive treatment of his risk factors and I discussed with him the importance of taking his rosuvastatin regularly.  6.  Sleep apnea: The patient will be starting CPAP soon.    Disposition:   FU with me in 6 months  Signed,  [name], MD  01/28/2022 4:24 PM    [place]

## 2022-01-30 ENCOUNTER — Institutional Professional Consult (permissible substitution): Payer: Medicare Other | Admitting: Internal Medicine

## 2022-02-07 ENCOUNTER — Telehealth: Payer: Self-pay | Admitting: Cardiovascular Disease

## 2022-02-07 NOTE — Telephone Encounter (Signed)
Caller stated Dr. Rosario Jacks would like a call back to consult with Dr. Fletcher Anon regarding this patient.

## 2022-02-17 ENCOUNTER — Encounter (INDEPENDENT_AMBULATORY_CARE_PROVIDER_SITE_OTHER): Payer: Self-pay

## 2022-02-19 ENCOUNTER — Telehealth: Payer: Self-pay | Admitting: Primary Care

## 2022-02-19 NOTE — Telephone Encounter (Signed)
Beth please advise if you can see this patient at 1:30pm on 11/10. Margie did pull his records from [facility] and it showed an AHI of 27.3 on BiPAP. Thank you!

## 2022-02-19 NOTE — Telephone Encounter (Signed)
Yes I can if slot is open

## 2022-02-19 NOTE — Telephone Encounter (Signed)
I spoke with the patient's wife, who is on his designated party release, and gave her the appointment information. Nothing further needed.

## 2022-02-28 ENCOUNTER — Ambulatory Visit (INDEPENDENT_AMBULATORY_CARE_PROVIDER_SITE_OTHER): Payer: Medicare Other | Admitting: Primary Care

## 2022-02-28 ENCOUNTER — Encounter: Payer: Self-pay | Admitting: Primary Care

## 2022-02-28 VITALS — BP 136/80 | HR 81 | Temp 97.5°F | Ht 75.0 in | Wt 274.0 lb

## 2022-02-28 DIAGNOSIS — Z8709 Personal history of other diseases of the respiratory system: Secondary | ICD-10-CM

## 2022-02-28 DIAGNOSIS — G4733 Obstructive sleep apnea (adult) (pediatric): Secondary | ICD-10-CM | POA: Diagnosis not present

## 2022-02-28 NOTE — Patient Instructions (Signed)
Orders: Lower EPAP 8cm h20; Change IPAP 15  Follow-up: 4 weeks with Beth NP or sooner if needed

## 2022-02-28 NOTE — Progress Notes (Unsigned)
$'@Patient's$  ID: Samuel Mason, male    DOB: [birth year], 71 y.o.   MRN: [id number]  Chief Complaint  Patient presents with   Follow-up    BiPAP. Has tried 3 face masks. Still having episodes. Wakes up with dry mouth.     Referring provider: [name], MD  HPI: 71 year old male, never smoked.  Past medical history significant for OSA, CAD, CVA, HTN, GERD, CKD stage 3, hyperlipidemia. Former patient of Dr. Ashby Dawes.   Previous [facility] pulmonary encounter: 10/18/2021 Patient presents today for sleep consult. He is following with cardiology for paroxysmal SVT. HR today 50-60. He saw cardiology yesterday and they did EKG. He has symptoms of snoring and restless sleep. He has hx moderate-severe OSA, previously on CPAP. Unsure if he still has CPAP machine, has not been wearing for several years. Typical bedtime is around 1am. It takes him on average <5 mins to fall asleep. He wakes up several times a night. He starts his day at 10am. He tends to be more tired during the day in the summer. Weight is down 17 lbs. He is open to resuming CPAP if needed. Epworth 5/24. Denies symptoms of narcolepsy, cataplexy or sleep walking.   Sleep questionnaire Symptoms- loud snoring, apnea, restless sleep Prior sleep study- **HST 08/20/2017 AHI 27.  Started AutoPap 8-15. Bedtime- 1am  Time to fall asleep- < 5 mins Nocturnal awakenings- 3-4 times Start day- 10am Do you operate heavy machinery- No Weight changes- 17 lbs down Do you wear CPAP- No Epworth-  5   12/24/2021- Interim hx  Patient contacted today to review sleep study results.  Patient had PSG on 12/10/2021 that showed evidence of severe obstructive sleep apnea, AHI 41.9/hr. central apnea index was 12/hour. Reviewed sleep study results with patient and treatment options.  He is open with starting CPAP and understands that he will need to return to the lab for a Pap titration study.  He had some questions about inspire device which we discussed in today.   If patient is unable to tolerate CPAP or BiPAP this may be an option for him.   OSA: -Patient has symptoms snoring, witnessed apnea and daytime sleepiness  -PSG on 12/10/2021 showed severe OSA, AHI 41.9/hr (cental apnea index 12/hr) -Recommending Pap titration study due to central apneas, patient in agreement with plan -Encourage patient focus on side sleeping position or elevate head of bed 30 degrees.  Advised against driving if experiencing excessive daytime sleepiness or fatigue.  Avoid alcohol or sedating medications prior to bedtime.  02/28/2022- interim hx  Patient presents today for OSA follow-up. He has sleep study in August 2023 that showed he had severe obstructive sleep apnea, AHI 41.9/hour (CAI 12/hour). He had PAP titration study on 01/09/22. He was noted to have moderate central sleep apnea as well as severe oxygen desaturations. BIPAP pressure selected for patient was 14/10cm h20 with medium size F&P full face mask.   He continues to struggle with BIPAP use. He has tried three different mask (two different full face masks and nasal pillow mask). He complains of dry mouth, humidification is set on 6. He is wearing full face mask. DME company is [employer]   He was just informed from  former colleague that his old place of work was exposed to asbestos. He used to work at [employer]. No acute/daytime respiratory symptoms.   [facility] download 01/26/22- 02/24/22 Usage 19/30 days; 15 days (50%) > 4 hours Average usage days used 5 hours 29 mins Pressure 14/10cm h20 [place]  26.8L (95%) AHI 24.6 (central 13.4/ obstructive 9)    Allergies  Allergen Reactions   Simvastatin     REACTION: Nausea    Immunization History  Administered Date(s) Administered   Influenza Inj Mdck Quad Pf 03/13/2018   Influenza, High Dose Seasonal PF 02/03/2017, 02/23/2018, 01/19/2019   PFIZER(Purple Top)SARS-COV-2 Vaccination 06/23/2019, 07/18/2019   Td 06/09/2002    Past Medical History:  Diagnosis Date    Asymptomatic PVCs    a. 07/2012 Holter: 7965 PVCs in 48 hrs; b. 07/2015 24hr Holter: 6000 PVCs (7%).   CAD (coronary artery disease)    a. 2009 Cath: nonobs dzs; b. 02/2010 Cath: LAD 23m D1 90, LCX 461mRCA min irregs, EF 60%-->Med Rx; c. 07/2012 Neg ETT; d. 05/2015 Neg MV. EF 73%.   Carotid arterial disease ([facility]   a. 11/2016 Carotid u/s: mild bilat carotid plaques.   CKD (chronic kidney disease), stage III ([place]   a. Creat 1.67 05/22/2017.   GERD (gastroesophageal reflux disease)    a. noncompliant w/ PPI.   History of echocardiogram    a. 09/2016 Echo: EF 60-65%, no orwma, mild MR, mildly dil LA/RA.   Hyperlipidemia    Hypertension    Morbid obesity ([facility]   Obstructive sleep apnea    a. noncompliant w/ CPAP.   TIA (transient ischemic attack)     Tobacco History: Social History   Tobacco Use  Smoking Status Never  Smokeless Tobacco Never   Counseling given: Not Answered   Outpatient Medications Prior to Visit  Medication Sig Dispense Refill   amLODipine (NORVASC) 10 MG tablet Take 1 tablet (10 mg total) by mouth daily. 90 tablet 3   Ascorbic Acid (VITAMIN C PO) Take by mouth daily.     aspirin 81 MG tablet Take 81 mg by mouth daily.     CRESTOR 10 MG tablet Take 10 mg by mouth as needed.  3   eplerenone (INSPRA) 50 MG tablet Take 50 mg by mouth daily.     famotidine (PEPCID) 20 MG tablet Take by mouth.     losartan-hydrochlorothiazide (HYZAAR) 100-25 MG per tablet Take 1 tablet by mouth daily.     Multiple Vitamins-Minerals (MULTIVITAMIN ADULTS PO) Take 1 tablet by mouth daily.     nebivolol (BYSTOLIC) 5 MG tablet Take 1.5 tablets (7.5 mg total) by mouth daily. 90 tablet 2   Omega-3 Fatty Acids (FISH OIL PO) Take 350 mg by mouth daily.     No facility-administered medications prior to visit.    Review of Systems  Review of Systems  Constitutional: Negative.   Respiratory:  Negative for cough, shortness of breath and wheezing.      Physical Exam  BP 136/80 (BP  Location: Left Wrist, Cuff Size: Normal)   Pulse 81   Temp (!) 97.5 F (36.4 C)   Ht '6\' 3"'$  (1.905 m)   Wt 274 lb (124.3 kg)   SpO2 100%   BMI 34.25 kg/m  Physical Exam Constitutional:      Appearance: Normal appearance.  HENT:     Head: Normocephalic and atraumatic.  Cardiovascular:     Rate and Rhythm: Normal rate and regular rhythm.  Pulmonary:     Effort: Pulmonary effort is normal.     Breath sounds: Normal breath sounds. No wheezing, rhonchi or rales.  Neurological:     General: No focal deficit present.     Mental Status: He is alert and oriented to person, place, and time. Mental status is  at baseline.  Psychiatric:        Mood and Affect: Mood normal.        Behavior: Behavior normal.        Thought Content: Thought content normal.      Lab Results:  CBC    Component Value Date/Time   WBC 6.1 10/31/2021 1542   RBC 4.78 10/31/2021 1542   HGB 14.1 10/31/2021 1542   HCT 42.2 10/31/2021 1542   PLT 231 10/31/2021 1542   MCV 88.3 10/31/2021 1542   MCH 29.5 10/31/2021 1542   MCHC 33.4 10/31/2021 1542   RDW 13.2 10/31/2021 1542   LYMPHSABS 2.2 10/31/2021 1542   MONOABS 0.5 10/31/2021 1542   EOSABS 0.1 10/31/2021 1542   BASOSABS 0.0 10/31/2021 1542    BMET    Component Value Date/Time   NA 138 10/31/2021 1542   K 4.1 10/31/2021 1542   CL 106 10/31/2021 1542   CO2 25 10/31/2021 1542   GLUCOSE 95 10/31/2021 1542   BUN 24 (H) 10/31/2021 1542   CREATININE 1.85 (H) 10/31/2021 1542   CALCIUM 9.1 10/31/2021 1542   GFRNONAA 39 (L) 10/31/2021 1542   GFRAA 42 (L) 07/28/2017 1653    BNP No results found for: "BNP"  ProBNP No results found for: "PROBNP"  Imaging: No results found.   Assessment & Plan:   OSA (obstructive sleep apnea) - Sleep study in August 2023, showed severe sleep apnea>> AHI 41.9/hour (CAI 12/hour). He had PAP titration study on 01/09/22. He was noted to have moderate central sleep apnea as well as severe oxygen desaturations. BIPAP  pressure selected for patient was 14/10cm h20 with medium size F&P full face mask.  - He continues to struggle with BIPAP use and is having issues with mask fit. - Recommend adjusting BIPAP pressure lower EPAP 8cm h20; Change IPAP 15 - He may need to return to sleep center for titration study specifically for AVAPS settings  - If continues to struggle he may be candidate for INSPIRE device  - Follow-up- 4 weeks with Beth NP or sooner if needed   Hx of asbestosis - Exposure to asbestosis. No acute respiratory symptoms. Getting baseline CXR   Martyn Ehrich, NP 03/03/2022

## 2022-03-03 ENCOUNTER — Ambulatory Visit
Admission: RE | Admit: 2022-03-03 | Discharge: 2022-03-03 | Disposition: A | Discharge status: Home / Self Care | Payer: Medicare Other | Source: Ambulatory Visit | Attending: Primary Care | Admitting: Primary Care

## 2022-03-03 ENCOUNTER — Telehealth: Payer: Medicare Other | Admitting: Primary Care

## 2022-03-03 ENCOUNTER — Ambulatory Visit
Admission: RE | Admit: 2022-03-03 | Discharge: 2022-03-03 | Disposition: A | Discharge status: Home / Self Care | Payer: Medicare Other | Attending: Primary Care | Admitting: Primary Care

## 2022-03-03 DIAGNOSIS — Z8709 Personal history of other diseases of the respiratory system: Secondary | ICD-10-CM | POA: Insufficient documentation

## 2022-03-03 DIAGNOSIS — G4733 Obstructive sleep apnea (adult) (pediatric): Secondary | ICD-10-CM

## 2022-03-03 NOTE — Assessment & Plan Note (Signed)
-   Sleep study in August 2023, showed severe sleep apnea>> AHI 41.9/hour (CAI 12/hour). He had PAP titration study on 01/09/22. He was noted to have moderate central sleep apnea as well as severe oxygen desaturations. BIPAP pressure selected for patient was 14/10cm h20 with medium size F&P full face mask.  - He continues to struggle with BIPAP use and is having issues with mask fit. - Recommend adjusting BIPAP pressure lower EPAP 8cm h20; Change IPAP 15 - He may need to return to sleep center for titration study specifically for AVAPS settings  - If continues to struggle he may be candidate for INSPIRE device  - Follow-up- 4 weeks with [facility] NP or sooner if needed

## 2022-03-03 NOTE — Progress Notes (Signed)
Reviewed and agree with assessment/plan.   Chesley Mires, MD [facility] Pulmonary/Critical Care 03/03/2022, 9:35 AM Pager:  [phone]

## 2022-03-03 NOTE — Assessment & Plan Note (Signed)
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 No acute respiratory symptoms. Getting baseline CXR

## 2022-03-05 ENCOUNTER — Telehealth: Payer: Self-pay | Admitting: Primary Care

## 2022-03-05 DIAGNOSIS — G4733 Obstructive sleep apnea (adult) (pediatric): Secondary | ICD-10-CM

## 2022-03-05 NOTE — Telephone Encounter (Signed)
Beth, please verify if IPAP should be 15 or lowered to 13? Thanks

## 2022-03-05 NOTE — Telephone Encounter (Signed)
Spoke to patient. He feels that IPAP pressure of 15cm is too strong. He would like to decrease pressure to 13cm.   Beth, please advise. Thanks

## 2022-03-05 NOTE — Telephone Encounter (Signed)
Ok, we can lower IPAP to 15. Keep EPAP at 8. I think he should return to sleep center for titration study specifically for AVAPS settings. If agreeing please order and specifically write this

## 2022-03-05 NOTE — Progress Notes (Signed)
CXR showed clear lungs, no evidence of asbestosis. Mild thoracic spine scoliosis and degenerative changes. If having back pain follow-up with either PCP or neurosurgery

## 2022-03-05 NOTE — Progress Notes (Signed)
Called pt and there was no answer-LMTCB °

## 2022-03-07 NOTE — Telephone Encounter (Signed)
Sorry. We can lower IPAP to 13. Epap 8. Needs titration for AVAPS

## 2022-03-10 ENCOUNTER — Telehealth: Payer: Self-pay | Admitting: *Deleted

## 2022-03-10 NOTE — Telephone Encounter (Signed)
Patient called to speak with someone about changing his CPAP machine settings and states he needs paperwork from the offices to change it. Please call and advise patient  [phone]

## 2022-03-10 NOTE — Telephone Encounter (Signed)
Spoke to patient. He stated that he is having issues with machine and he has an appt with [employer] today to have machine serviced.  Nothing further needed.

## 2022-03-10 NOTE — Telephone Encounter (Signed)
Patient is aware that order was placed to [facility] for pressure change. Paperwork is not needed.  He voiced his understanding and had no further questions. Nothing further needed.

## 2022-03-10 NOTE — Telephone Encounter (Signed)
Order placed to [facility] for pressure change.  Lm for patient.

## 2022-03-10 NOTE — Telephone Encounter (Addendum)
Patient returning call. Informed of order being sent- states he is having problems with his machine. He is trying to get an appt with his DME for them to look at it.

## 2022-03-24 DIAGNOSIS — G4733 Obstructive sleep apnea (adult) (pediatric): Secondary | ICD-10-CM

## 2022-03-24 NOTE — Telephone Encounter (Signed)
Beth, please advise. Thanks 

## 2022-03-24 NOTE — Telephone Encounter (Signed)
He needs to see Dr. Mortimer Fries or Halford Chessman. He has severe OSA. He has central apneas so not likely a good candidate for any other treatment IE/ oral appliance or Inspire.   Needs to return to sleep center for titration study specifically for AVAPS settings- this is very important.

## 2022-03-24 NOTE — Telephone Encounter (Signed)
Dr. Halford Chessman, please advise if okay to schedule in 45mn slot on 04/25/2022? Thanks

## 2022-04-07 ENCOUNTER — Ambulatory Visit: Payer: Medicare Other | Attending: Otolaryngology

## 2022-04-07 DIAGNOSIS — G4733 Obstructive sleep apnea (adult) (pediatric): Secondary | ICD-10-CM | POA: Insufficient documentation

## 2022-04-09 ENCOUNTER — Telehealth (INDEPENDENT_AMBULATORY_CARE_PROVIDER_SITE_OTHER): Payer: Medicare Other | Admitting: Primary Care

## 2022-04-09 ENCOUNTER — Telehealth: Payer: Self-pay

## 2022-04-09 DIAGNOSIS — G4733 Obstructive sleep apnea (adult) (pediatric): Secondary | ICD-10-CM

## 2022-04-09 NOTE — Telephone Encounter (Signed)
Per Beth verbally--recommend reaching out to [employer] and making them aware that we are in the process of working on patient's cpap usage. Pending bipap titration results.   Lm for Beth.

## 2022-04-09 NOTE — Assessment & Plan Note (Signed)
Sleep study in August 2023, showed severe sleep apnea>> AHI 41.9/hour (CAI 12/hour). He had PAP titration study on 01/09/22. He was noted to have moderate central sleep apnea as well as severe oxygen desaturations. BIPAP pressure selected for patient was 14/10cm h20 with medium size F&P full face mask.   -  Patient continues to struggle with BIPAP tolerability. He is likely not a good candidate for oral appliance or inspire since he is having a lot of central apneas. He underwent BIPAP titration study on 04/07/22 for AVAPS settings, report is pending. Advised patient keep apt with Dr. Halford Chessman in January.

## 2022-04-09 NOTE — Progress Notes (Signed)
Virtual Visit via Video Note  I connected with Samuel Mason on 04/09/22 at  2:00 PM EST by a video enabled telemedicine application and verified that I am speaking with the correct person using two identifiers.  Location: Patient: Home Provider: Office    I discussed the limitations of evaluation and management by telemedicine and the availability of in person appointments. The patient expressed understanding and agreed to proceed.  History of Present Illness: 71 year old male, never smoked.  Past medical history significant for OSA, CAD, CVA, HTN, GERD, CKD stage 3, hyperlipidemia. Former patient of Dr. Ashby Dawes.   Previous [facility] pulmonary encounter: 10/18/2021 Patient presents today for sleep consult. He is following with cardiology for paroxysmal SVT. HR today 50-60. He saw cardiology yesterday and they did EKG. He has symptoms of snoring and restless sleep. He has hx moderate-severe OSA, previously on CPAP. Unsure if he still has CPAP machine, has not been wearing for several years. Typical bedtime is around 1am. It takes him on average <5 mins to fall asleep. He wakes up several times a night. He starts his day at 10am. He tends to be more tired during the day in the summer. Weight is down 17 lbs. He is open to resuming CPAP if needed. Epworth 5/24. Denies symptoms of narcolepsy, cataplexy or sleep walking.   Sleep questionnaire Symptoms- loud snoring, apnea, restless sleep Prior sleep study- **HST 08/20/2017 AHI 27.  Started AutoPap 8-15. Bedtime- 1am  Time to fall asleep- < 5 mins Nocturnal awakenings- 3-4 times Start day- 10am Do you operate heavy machinery- No Weight changes- 17 lbs down Do you wear CPAP- No Epworth-  5   12/24/2021 Patient contacted today to review sleep study results.  Patient had PSG on 12/10/2021 that showed evidence of severe obstructive sleep apnea, AHI 41.9/hr. central apnea index was 12/hour. Reviewed sleep study results with patient and treatment  options.  He is open with starting CPAP and understands that he will need to return to the lab for a Pap titration study.  He had some questions about inspire device which we discussed in today.  If patient is unable to tolerate CPAP or BiPAP this may be an option for him.  02/28/2022 Patient presents today for OSA follow-up. He has sleep study in August 2023 that showed he had severe obstructive sleep apnea, AHI 41.9/hour (CAI 12/hour). He had PAP titration study on 01/09/22. He was noted to have moderate central sleep apnea as well as severe oxygen desaturations. BIPAP pressure selected for patient was 14/10cm h20 with medium size F&P full face mask.   He continues to struggle with BIPAP use. He has tried three different mask (two different full face masks and nasal pillow mask). He complains of dry mouth, humidification is set on 6. He is wearing full face mask. DME company is [employer]   He was just informed from  former colleague that his old place of work was exposed to asbestos. He used to work at [employer]. No acute/daytime respiratory symptoms.   Airview download 01/26/22- 02/24/22 Usage 19/30 days; 15 days (50%) > 4 hours Average usage days used 5 hours 29 mins Pressure 14/10cm h20 Airleaks 26.8L (95%) AHI 24.6 (central 13.4/ obstructive 9)   04/09/2022- Interim hx  Patient contacted today for virtual follow-up. He has sleep study in August 2023 that showed he had severe obstructive sleep apnea, AHI 41.9/hour (CAI 12/hour). He had PAP titration study on 01/09/22. He was noted to have moderate central sleep apnea as well  as severe oxygen desaturations.  Patient is having difficulty toleraing BIPAP. He had BIPAP titration study for AVAPS Monday December 18th, results have not officially been read by sleep doctor. He has an apt in January with Dr. Halford Chessman. Advised he not stop wearing his BIPAP or return machine, we will need to adjust his settings.    Observations/Objective:  - Appears well without  respiratory symptoms   Assessment and Plan:  Severe sleep apnea - Sleep study in August 2023, showed severe sleep apnea>> AHI 41.9/hour (CAI 12/hour). He had PAP titration study on 01/09/22. He was noted to have moderate central sleep apnea as well as severe oxygen desaturations. BIPAP pressure selected for patient was 14/10cm h20 with medium size F&P full face mask.   -  Patient continues to struggle with BIPAP tolerability. He is likely not a good candidate for oral appliance or inspire due to central apneas. He underwent BIPAP titration study on 04/07/22 for AVAPS settings, report is pending. Advised patient keep apt with Dr. Halford Chessman in January.  Follow Up Instructions:   - January with Dr. Halford Chessman   I discussed the assessment and treatment plan with the patient. The patient was provided an opportunity to ask questions and all were answered. The patient agreed with the plan and demonstrated an understanding of the instructions.   The patient was advised to call back or seek an in-person evaluation if the symptoms worsen or if the condition fails to improve as anticipated.  I provided 22 minutes of non-face-to-face time during this encounter.   Martyn Ehrich, NP

## 2022-04-09 NOTE — Telephone Encounter (Signed)
[name] is aware of below message. He will note in patient's chart.  Nothing further needed.

## 2022-04-13 NOTE — Progress Notes (Signed)
Reviewed and agree with assessment/plan.   Chesley Mires, MD [facility] Pulmonary/Critical Care 04/13/2022, 7:20 PM Pager:  [phone]

## 2022-04-25 ENCOUNTER — Encounter: Payer: Self-pay | Admitting: Pulmonary Disease

## 2022-04-25 ENCOUNTER — Ambulatory Visit (INDEPENDENT_AMBULATORY_CARE_PROVIDER_SITE_OTHER): Payer: Medicare Other | Admitting: Pulmonary Disease

## 2022-04-25 VITALS — BP 142/80 | HR 60 | Temp 98.0°F | Ht 74.0 in | Wt 280.4 lb

## 2022-04-25 DIAGNOSIS — Z789 Other specified health status: Secondary | ICD-10-CM

## 2022-04-25 DIAGNOSIS — G4733 Obstructive sleep apnea (adult) (pediatric): Secondary | ICD-10-CM | POA: Diagnosis not present

## 2022-04-25 NOTE — Patient Instructions (Signed)
Will have your Bipap changed to maximum IPAP 16, minimum EPAP 5, and pressure support 4 cm water pressure.  Ask your dentist about whether you could be a candidate for an oral appliance to treat obstructive sleep apnea.  Follow up in 6 weeks - okay to schedule as a video visit.

## 2022-04-25 NOTE — Progress Notes (Signed)
[place] Pulmonary, Critical Care, and Sleep Medicine  Chief Complaint  Patient presents with   Follow-up    Unable to tolerate bipap. Last worn >3 weeks ago.      Past Surgical History:  He  has a past surgical history that includes Cardiac catheterization (2011); Cardiac catheterization (2009); Colonoscopy with propofol (N/A, 10/19/2018); and LEFT HEART CATH AND CORONARY ANGIOGRAPHY (N/A, 11/27/2021).  Past Medical History:  CAD, CKD 3a, GERD, HLD, HTN, TIA  Constitutional:  BP (!) 142/80 (BP Location: Left Arm, Cuff Size: Normal)   Pulse 60   Temp 98 F (36.7 C) (Temporal)   Ht '6\' 2"'$  (1.88 m)   Wt 280 lb 6.4 oz (127.2 kg)   SpO2 100%   BMI 36.00 kg/m   Brief Summary:  Samuel Mason is a 72 y.o. male with obstructive sleep apnea.      Subjective:   He is here with his wife.  He was previously seen by Dr. Ashby Mason and more recently by Samuel Mason.  His most recent diagnostic study showed severe sleep apnea.  He was tried on CPAP and Bipap, but hasn't been able to adjust to using these.  He has a full face mask, and this is comfortable.  His main issues are mouth dryness, sinus dryness alternating with congestion, and pressure feeling in his face.  He had a study in December with AVAPS, but wasn't able to tolerate using this either.  He only had 54 minutes of documented sleep during this study.  Physical Exam:   Appearance - well kempt   ENMT - no sinus tenderness, no oral exudate, no LAN, Mallampati 3 airway, no stridor  Respiratory - equal breath sounds bilaterally, no wheezing or rales  CV - s1s2 regular rate and rhythm, no murmurs  Ext - no clubbing, no edema  Skin - no rashes  Psych - normal mood and affect   Sleep Tests:  HST 08/20/17 >> AHI 27, SpO2 low 81% PSG 12/09/21 >> AHI 41.9, SpO2 low 80% Bipap 01/09/22 >> Bipap 14/10 cm H2O >> AHI 0 Bipap 04/08/22 >> AVAPS with EPAP 7 cm H2O (only 54 minutes of sleep time)  Cardiac Tests:  Echo 10/04/21 >>  EF 60 to 65%, mild LVH, mild/mod MR  Social History:  He  reports that he has never smoked. He has never used smokeless tobacco. He reports that he does not drink alcohol and does not use drugs.  Family History:  His family history includes Heart attack in his father.    Discussion:  He has severe obstructive sleep apnea.  This is associated with history of coronary artery disease, hypertension and previous TIA.  He was tried on CPAP, but was not able to tolerate this.  He has tried several different types of masks, and seems to do best with a full face mask.  His recent AVAPS study was sub-optimal since he only had 54 minutes of total sleep time during the study.  He has an auto Bipap at present, and is willing to try adjustments to this again.  We discussed alternative therapies if he isn't able to adjust to changes for auto Bipap.  Assessment/Plan:   Obstructive sleep apnea. - reviewed his sleep study results - discussed how untreated sleep apnea can impact his health - will try changing him to auto Bipap with max IPAP 16, min EPAP 5, PS 4 cm H2O - advised him to try nasal saline sprays and adjust humidifier setting to help with nasal  and mouth dryness - discussed techniques to help acclimatize to using Bipap - he will check with his dentist about whether he could be a candidate for an oral appliance  - discussed what would be involved with getting assessed for an Inspire device; main issue at present is that he would need to get his BMI below 35, and to do this his goal is to get his weight below 265 lbs  Time Spent Involved in Patient Care on Day of Examination:  38 minutes  Follow up:   Patient Instructions  Will have your Bipap changed to maximum IPAP 16, minimum EPAP 5, and pressure support 4 cm water pressure.  Ask your dentist about whether you could be a candidate for an oral appliance to treat obstructive sleep apnea.  Follow up in 6 weeks - okay to schedule as a video  visit.   Medication List:   Allergies as of 04/25/2022       Reactions   Simvastatin    REACTION: Nausea        Medication List        Accurate as of April 25, 2022 12:03 PM. If you have any questions, ask your nurse or doctor.          STOP taking these medications    MULTIVITAMIN ADULTS PO Stopped by: Samuel Mires, MD       TAKE these medications    amLODipine 10 MG tablet Commonly known as: NORVASC Take 10 mg by mouth daily. 1.5tab daily   aspirin 81 MG tablet Take 81 mg by mouth daily.   carvedilol 6.25 MG tablet Commonly known as: COREG Take 12.5 mg by mouth 2 (two) times daily.   Crestor 10 MG tablet Generic drug: rosuvastatin Take 10 mg by mouth as needed.   eplerenone 50 MG tablet Commonly known as: INSPRA Take 50 mg by mouth daily.   famotidine 20 MG tablet Commonly known as: PEPCID Take by mouth.   FISH OIL PO Take 350 mg by mouth daily.   losartan-hydrochlorothiazide 100-25 MG tablet Commonly known as: HYZAAR Take 1 tablet by mouth daily.   VITAMIN C PO Take by mouth daily.        Signature:  Samuel Mires, MD [place] Pager - [phone] 04/25/2022, 12:03 PM

## 2022-05-01 ENCOUNTER — Ambulatory Visit: Payer: Medicare Other | Admitting: Podiatry

## 2022-06-02 ENCOUNTER — Encounter (HOSPITAL_BASED_OUTPATIENT_CLINIC_OR_DEPARTMENT_OTHER): Payer: Self-pay | Admitting: Pulmonary Disease

## 2022-06-02 ENCOUNTER — Telehealth (INDEPENDENT_AMBULATORY_CARE_PROVIDER_SITE_OTHER): Payer: Medicare Other | Admitting: Pulmonary Disease

## 2022-06-02 DIAGNOSIS — G4733 Obstructive sleep apnea (adult) (pediatric): Secondary | ICD-10-CM | POA: Diagnosis not present

## 2022-06-02 DIAGNOSIS — Z789 Other specified health status: Secondary | ICD-10-CM | POA: Diagnosis not present

## 2022-06-02 MED ORDER — FAMOTIDINE 20 MG PO TABS: 20.0000 mg | ORAL_TABLET | Freq: Two times a day (BID) | ORAL | 1 refills | Status: DC | PRN

## 2022-06-02 NOTE — Progress Notes (Signed)
[place] Pulmonary, Critical Care, and Sleep Medicine  Chief Complaint  Patient presents with   Follow-up    Stopped using machine    Past Surgical History:  He  has a past surgical history that includes Cardiac catheterization (2011); Cardiac catheterization (2009); Colonoscopy with propofol (N/A, 10/19/2018); and LEFT HEART CATH AND CORONARY ANGIOGRAPHY (N/A, 11/27/2021).  Past Medical History:  CAD, CKD 3a, GERD, HLD, HTN, TIA  Constitutional:  There were no vitals taken for this visit.  Brief Summary:  Samuel Mason is a 72 y.o. male with obstructive sleep apnea.      Subjective:   Virtual Visit via Video Note  I connected with [name] on 06/02/22 at  1:00 PM EST by a video enabled telemedicine application and verified that I am speaking with the correct person using two identifiers.  Location: Patient: home Provider: medical office   I discussed the limitations of evaluation and management by telemedicine and the availability of in person appointments. The patient expressed understanding and agreed to proceed.  History of Present Illness:  He continues to struggle with Bipap.  He gets bloated and gassy.  Causes reflux.  He is asking about alternative therapies.  Physical Exam:  Alert, speaking in full sentences, no respiratory distress.    Sleep Tests:  HST 08/20/17 >> AHI 27, SpO2 low 81% PSG 12/09/21 >> AHI 41.9, SpO2 low 80% Bipap 01/09/22 >> Bipap 14/10 cm H2O >> AHI 0 Bipap 04/08/22 >> AVAPS with EPAP 7 cm H2O (only 54 minutes of sleep time)  Cardiac Tests:  Echo 10/04/21 >> EF 60 to 65%, mild LVH, mild/mod MR  Social History:  He  reports that he has never smoked. He has never used smokeless tobacco. He reports that he does not drink alcohol and does not use drugs.  Family History:  His family history includes Heart attack in his father.     Assessment/Plan:   Obstructive sleep apnea. - he continues to struggle with PAP therapy, mostly  related to aerophagia and GI side effects - he is willing to try Bipap again for now - will change Bipap to 9/5 cm H2O - he will use pepcid prn for GI side effects - will arrange for referral to Dr. Augustina Mood to assess for an oral appliance as an option to treat obstructive sleep apnea  Time Spent Involved in Patient Care on Day of Examination:   I discussed the assessment and treatment plan with the patient. The patient was provided an opportunity to ask questions and all were answered. The patient agreed with the plan and demonstrated an understanding of the instructions.   The patient was advised to call back or seek an in-person evaluation if the symptoms worsen or if the condition fails to improve as anticipated.  I provided 36 minutes of non-face-to-face time during this encounter.  Follow up:   Patient Instructions  Will have your Bipap changed to 9/5 cm water pressure  Pepcid twice per day as needed for reflux  Will arrange for dental referral to assess for an oral appliance to treat obstructive sleep apnea  Follow up in 6 months  Medication List:   Allergies as of 06/02/2022       Reactions   Simvastatin    REACTION: Nausea        Medication List        Accurate as of June 02, 2022  5:49 PM. If you have any questions, ask your nurse or doctor.  amLODipine 10 MG tablet Commonly known as: NORVASC Take 10 mg by mouth daily. 1.5tab daily   aspirin 81 MG tablet Take 81 mg by mouth daily.   carvedilol 6.25 MG tablet Commonly known as: COREG Take 12.5 mg by mouth 2 (two) times daily.   Crestor 10 MG tablet Generic drug: rosuvastatin Take 10 mg by mouth as needed.   eplerenone 50 MG tablet Commonly known as: INSPRA Take 50 mg by mouth daily.   famotidine 20 MG tablet Commonly known as: PEPCID Take 1 tablet (20 mg total) by mouth 2 (two) times daily as needed for heartburn or indigestion. What changed:  how much to take when to take  this reasons to take this Changed by: Chesley Mires, MD   FISH OIL PO Take 350 mg by mouth daily.   losartan-hydrochlorothiazide 100-25 MG tablet Commonly known as: HYZAAR Take 1 tablet by mouth daily.   VITAMIN C PO Take by mouth daily.        Signature:  Chesley Mires, MD [place] Pager - [phone] 06/02/2022, 5:49 PM

## 2022-06-02 NOTE — Patient Instructions (Signed)
Will have your Bipap changed to 9/5 cm water pressure  Pepcid twice per day as needed for reflux  Will arrange for dental referral to assess for an oral appliance to treat obstructive sleep apnea  Follow up in 6 months

## 2022-06-12 ENCOUNTER — Encounter (HOSPITAL_BASED_OUTPATIENT_CLINIC_OR_DEPARTMENT_OTHER): Payer: Self-pay | Admitting: Pulmonary Disease

## 2022-06-12 NOTE — Telephone Encounter (Signed)
Please let him know a referral to Dr. Augustina Mood was placed on 06/02/22 to have him assessed for an oral appliance to treat obstructive sleep apnea.  Please have [place] assess the status of this referral.

## 2022-06-12 NOTE — Telephone Encounter (Signed)
Dr. Halford Chessman, please advise on pt's message regarding having to return his pap machine and next steps. Thanks.

## 2022-06-13 NOTE — Telephone Encounter (Signed)
Routing to [facility] to check on the referral that was placed. I also provided pt with Dr. Corky Sing phone number in case he wanted to call her office to see if he could get an appt scheduled sooner.

## 2022-06-17 NOTE — Telephone Encounter (Signed)
Confirmation was received from fax on the referral 2/14 to [facility] office

## 2022-07-07 ENCOUNTER — Telehealth: Payer: Self-pay | Admitting: Pulmonary Disease

## 2022-07-07 NOTE — Telephone Encounter (Signed)
Attempted to call Caryl Pina from Dr. Corky Sing office but unable to reach. Left message for her to return call.

## 2022-07-07 NOTE — Telephone Encounter (Signed)
Dr. Katharine Look office calling in for his baseline sleep study (first sleep study done) Fax: [phone]

## 2022-07-08 NOTE — Telephone Encounter (Signed)
Dr.'s office calling again. Please try again or fax sleep study as req below. Thanks.   If any questions call [phone]  She is the only one there so if there is no sleep study leave a message.

## 2022-07-08 NOTE — Telephone Encounter (Signed)
Printed off original sleep study. Faxed to Dr Betsey Holiday office. Nothing further needed

## 2022-07-31 ENCOUNTER — Ambulatory Visit (INDEPENDENT_AMBULATORY_CARE_PROVIDER_SITE_OTHER): Payer: Medicare Other | Admitting: Podiatry

## 2022-07-31 ENCOUNTER — Encounter: Payer: Self-pay | Admitting: Podiatry

## 2022-07-31 VITALS — BP 115/76 | HR 72

## 2022-07-31 DIAGNOSIS — B351 Tinea unguium: Secondary | ICD-10-CM | POA: Diagnosis not present

## 2022-07-31 DIAGNOSIS — N183 Chronic kidney disease, stage 3 unspecified: Secondary | ICD-10-CM

## 2022-07-31 DIAGNOSIS — M79674 Pain in right toe(s): Secondary | ICD-10-CM

## 2022-07-31 DIAGNOSIS — M79675 Pain in left toe(s): Secondary | ICD-10-CM

## 2022-07-31 NOTE — Progress Notes (Signed)
This patient returns to my office for at risk foot care.  This patient requires this care by a professional since this patient will be at risk due to having PAD and chronic kidney disease  This patient is unable to cut nails himself since the patient cannot reach his nails.These nails are painful walking and wearing shoes.  This patient presents for at risk foot care today.  General Appearance  Alert, conversant and in no acute stress.  Vascular  Dorsalis pedis and posterior tibial  pulses are palpable  bilaterally.  Capillary return is within normal limits  bilaterally. Temperature is within normal limits  bilaterally.  Neurologic  Senn-Weinstein monofilament wire test within normal limits  bilaterally. Muscle power within normal limits bilaterally.  Nails Thick disfigured discolored nails with subungual debris  from hallux to fifth toes bilaterally. No evidence of bacterial infection or drainage bilaterally.  Orthopedic  No limitations of motion  feet .  No crepitus or effusions noted.  No bony pathology or digital deformities noted.  Skin  normotropic skin with no porokeratosis noted bilaterally.  No signs of infections or ulcers noted.     Onychomycosis  Pain in right toes  Pain in left toes  Consent was obtained for treatment procedures.   Mechanical debridement of nails 1-5  bilaterally performed with a nail nipper.  Filed with dremel without incident.    Return office visit    3 months                 Told patient to return for periodic foot care and evaluation due to potential at risk complications.   Helane Gunther DPM

## 2022-08-15 ENCOUNTER — Other Ambulatory Visit: Payer: Self-pay

## 2022-08-18 ENCOUNTER — Other Ambulatory Visit: Payer: Self-pay

## 2022-08-18 NOTE — Progress Notes (Unsigned)
Celso Amy, PA-C [address]  [address]  [place], [place] [address]  Main: [phone]  Fax: [phone]   Gastroenterology Consultation  Referring Provider:     Sherrie Mustache, MD Primary Care Physician:  Sherrie Mustache, MD Primary Gastroenterologist:  Dr. Wyline Mood  Reason for Consultation:     GERD        HPI:   Samuel Mason is a 72 y.o. y/o male referred for consultation & management  by  Sherrie Mustache, MD to evaluate GERD.  He was recently started on H2RB, Famotidine 20mg  BID.  No previous EGD. He has stage 3 - 4 CKD and use of PPI was discouraged.  Lab 07/08/22 showed BUN 18, Cr 1.6,GFR46.  He states he was dx with sleep apnea and started on CPAP a few months ago.  He was not able to tolerate the CPAP because he was swallowing a lot of air and thinks this was the cause for his nocturnal GERD flare-up.  He had excessive belching.  Recently stopped using CPAP and upper GI symptoms improved.  He had a flare up of acid reflux with heartburn, nocturnal reflux and acid regurgitation which lasted 3 weeks.  He saw his PCP and was started on Famotidine 20mg  BID in the past 2-3 weeks, with great benefit.  His reflux has greatly improved.  He has not had any heartburn or reflux in the past 4 days.  He has not had any dysphagia, abdominal pain, or unintentional weight loss.  Med Hx significant for CAD, CKD, GERD, Sleep apnea, TIA, PVCs, pSVT.  Cardiologist Dr. Jerolyn Center.  Takes 81mg  aspirin daily.  No other anticoagulants.  Taking [place].  Echo 09/2021 showed LVEF 60-65%.  Last Colonoscopy 09/2018 by Dr. Tobi Bastos showed one small 5mm polyp removed and good prep.  Repeat in 5 years (due 6/025) due to personal history of colon polyps.  Past Medical History:  Diagnosis Date   Asymptomatic PVCs    a. 07/2012 Holter: 7965 PVCs in 48 hrs; b. 07/2015 24hr Holter: 6000 PVCs (7%).   CAD (coronary artery disease)    a. 2009 Cath: nonobs dzs; b. 02/2010 Cath: LAD 41m, D1 90, LCX 79m, RCA min  irregs, EF 60%-->Med Rx; c. 07/2012 Neg ETT; d. 05/2015 Neg MV. EF 73%.   Carotid arterial disease (HCC)    a. 11/2016 Carotid u/s: mild bilat carotid plaques.   CKD (chronic kidney disease), stage III (HCC)    a. Creat 1.67 05/22/2017.   GERD (gastroesophageal reflux disease)    a. noncompliant w/ PPI.   History of echocardiogram    a. 09/2016 Echo: EF 60-65%, no orwma, mild MR, mildly dil LA/RA.   Hyperlipidemia    Hypertension    Morbid obesity (HCC)    Obstructive sleep apnea    a. noncompliant w/ CPAP.   TIA (transient ischemic attack)     Past Surgical History:  Procedure Laterality Date   CARDIAC CATHETERIZATION  2011   50% mid LAD stenosis, 40% proximal LCX, EF 60%   CARDIAC CATHETERIZATION  2009   Mild CAD             COLONOSCOPY WITH PROPOFOL N/A 10/19/2018   Procedure: COLONOSCOPY WITH PROPOFOL;  Surgeon: Wyline Mood, MD;  Location: [facility] ENDOSCOPY;  Service: Gastroenterology;  Laterality: N/A;   LEFT HEART CATH AND CORONARY ANGIOGRAPHY N/A 11/27/2021   Procedure: LEFT HEART CATH AND CORONARY ANGIOGRAPHY with poss PCI;  Surgeon: Iran Ouch, MD;  Location: [facility] INVASIVE CV  LAB;  Service: Cardiovascular;  Laterality: N/A;    Prior to Admission medications   Medication Sig Start Date End Date Taking? Authorizing Provider  amLODipine (NORVASC) 10 MG tablet Take 10 mg by mouth daily. 1.5tab daily    [provider]  aspirin 81 MG tablet Take 81 mg by mouth daily.    [provider]  carvedilol (COREG) 6.25 MG tablet Take 12.5 mg by mouth 2 (two) times daily. 04/17/22   [provider]  CRESTOR 10 MG tablet Take 10 mg by mouth as needed. 06/27/15   [provider]  eplerenone (INSPRA) 50 MG tablet Take 50 mg by mouth daily.    [provider]  famotidine (PEPCID) 20 MG tablet Take 1 tablet (20 mg total) by mouth 2 (two) times daily as needed for heartburn or indigestion. 06/02/22   Coralyn Helling, MD  FARXIGA 10 MG TABS tablet Take 10 mg  by mouth daily. 08/07/22   [provider]  losartan-hydrochlorothiazide (HYZAAR) 100-25 MG per tablet Take 1 tablet by mouth daily.    [provider]  nebivolol (BYSTOLIC) 5 MG tablet  07/07/22   [provider]  Omega-3 Fatty Acids (FISH OIL PO) Take 350 mg by mouth daily.    [provider]  sildenafil (VIAGRA) 100 MG tablet Take by mouth as directed. 02/22/22   [provider]    Family History  Problem Relation Age of Onset   Heart attack Father        MI     Social History   Tobacco Use   Smoking status: Never   Smokeless tobacco: Never  Vaping Use   Vaping Use: Never used  Substance Use Topics   Alcohol use: No    Comment: social  drinker   Drug use: No    Allergies as of 08/19/2022 - Review Complete 08/19/2022  Allergen Reaction Noted   Simvastatin      Review of Systems:    All systems reviewed and negative except where noted in HPI.   Physical Exam:  BP 92/61   Pulse (!) 55   Temp 98.5 F (36.9 C)   Ht 6\' 2"  (1.88 m)   Wt 270 lb 12.8 oz (122.8 kg)   BMI 34.77 kg/m  No LMP for male patient. Psych:  Alert and cooperative. Normal mood and affect. General:   Alert,  Well-developed, well-nourished, pleasant and cooperative in NAD Head:  Normocephalic and atraumatic. Eyes:  Sclera clear, no icterus.   Conjunctiva pink. Ears:  Normal auditory acuity. Neurologic:  Alert and oriented x3;  grossly normal neurologically. Psych:  Alert and cooperative. Normal mood and affect.  Imaging Studies: No results found.  Assessment and Plan:   Samuel Mason is a 72 y.o. y/o male has been referred for GERD.  He had flareup of GERD for 3 weeks.  Was started on Famotidine 20mg  BID and his acid reflux resolved.  He is currently assymptomatic now.  Denies dysphagia, unintentional weight loss, abdminal pain, or other alarm symptoms.  GERD  We discussed scheduling EGD, however patient declined because his acid reflux resolved on  Famotidine.  If he has recurrent GERD or develops dysphagia, then EGD is next step.  GERD Treatment discussed at length.  Continue H2RB Pepcid 20mg  once or twice daily as needed.  Recommend Lifestyle Modifications to prevent Acid Reflux.  Rec. Avoid coffee, sodas, peppermint, citrus fruits, and spicey foods.  Avoid eating 2-3 hours before bedtime.   We discussed adverse  side effects of PPIs to include vitamin deficiencies, osteoporosis, renal insufficiency, dementia and increased risk of C. Difficile.  OK to add H2RB (Pepcid 20mg  daily) or antiacid if needed for breakthrough acid reflux.   2.  Chronic Kidney Disease: Stage 3-4 CKD.  Avoid PPIs and NSAIDS.    3.   CAD, Stable, Assymptomatic  4.   Hx Colon Polyps  5 year repeat colonoscopy will be due 09/2023  Follow up 09/2023 for repeat colonoscopy.  F/U sooner if GERD symptoms return.  Celso Amy, PA-C

## 2022-08-19 ENCOUNTER — Ambulatory Visit (INDEPENDENT_AMBULATORY_CARE_PROVIDER_SITE_OTHER): Payer: Medicare Other | Admitting: Physician Assistant

## 2022-08-19 ENCOUNTER — Encounter: Payer: Self-pay | Admitting: Physician Assistant

## 2022-08-19 VITALS — BP 92/61 | HR 55 | Temp 98.5°F | Ht 74.0 in | Wt 270.8 lb

## 2022-08-19 DIAGNOSIS — K219 Gastro-esophageal reflux disease without esophagitis: Secondary | ICD-10-CM

## 2022-08-19 DIAGNOSIS — Z86010 Personal history of colonic polyps: Secondary | ICD-10-CM

## 2022-08-19 DIAGNOSIS — N184 Chronic kidney disease, stage 4 (severe): Secondary | ICD-10-CM | POA: Diagnosis not present

## 2022-08-19 NOTE — Patient Instructions (Signed)
Please call the office if you have any questions or concerns.  

## 2022-08-20 ENCOUNTER — Other Ambulatory Visit: Payer: Self-pay

## 2022-09-29 ENCOUNTER — Other Ambulatory Visit (HOSPITAL_BASED_OUTPATIENT_CLINIC_OR_DEPARTMENT_OTHER): Payer: Self-pay | Admitting: Pulmonary Disease

## 2022-10-01 ENCOUNTER — Other Ambulatory Visit: Payer: Self-pay | Admitting: Internal Medicine

## 2022-10-01 DIAGNOSIS — G451 Carotid artery syndrome (hemispheric): Secondary | ICD-10-CM

## 2022-10-01 DIAGNOSIS — R930 Abnormal findings on diagnostic imaging of skull and head, not elsewhere classified: Secondary | ICD-10-CM

## 2022-10-07 ENCOUNTER — Ambulatory Visit
Admission: RE | Admit: 2022-10-07 | Discharge: 2022-10-07 | Disposition: A | Discharge status: Home / Self Care | Payer: Medicare Other | Source: Ambulatory Visit | Attending: Internal Medicine | Admitting: Internal Medicine

## 2022-10-07 DIAGNOSIS — G451 Carotid artery syndrome (hemispheric): Secondary | ICD-10-CM | POA: Insufficient documentation

## 2022-10-07 DIAGNOSIS — R930 Abnormal findings on diagnostic imaging of skull and head, not elsewhere classified: Secondary | ICD-10-CM

## 2022-10-07 MED ORDER — GADOBUTROL 1 MMOL/ML IV SOLN
10.0000 mL | Freq: Once | INTRAVENOUS | Status: AC | PRN
  Administered 2022-10-07: 10 mL via INTRAVENOUS

## 2022-10-14 ENCOUNTER — Other Ambulatory Visit (HOSPITAL_BASED_OUTPATIENT_CLINIC_OR_DEPARTMENT_OTHER): Payer: Self-pay | Admitting: Pulmonary Disease

## 2023-01-29 ENCOUNTER — Ambulatory Visit (INDEPENDENT_AMBULATORY_CARE_PROVIDER_SITE_OTHER): Payer: Medicare Other | Admitting: Podiatry

## 2023-01-29 DIAGNOSIS — Z91199 Patient's noncompliance with other medical treatment and regimen due to unspecified reason: Secondary | ICD-10-CM

## 2023-02-27 NOTE — Progress Notes (Signed)
1. No-show for appointment     

## 2023-03-05 ENCOUNTER — Encounter: Payer: Self-pay | Admitting: Cardiovascular Disease

## 2023-03-05 ENCOUNTER — Ambulatory Visit: Payer: Medicare Other | Attending: Cardiovascular Disease | Admitting: Cardiovascular Disease

## 2023-03-05 VITALS — BP 104/60 | HR 59 | Ht 75.0 in | Wt 268.1 lb

## 2023-03-05 DIAGNOSIS — I2510 Atherosclerotic heart disease of native coronary artery without angina pectoris: Secondary | ICD-10-CM | POA: Diagnosis present

## 2023-03-05 DIAGNOSIS — I493 Ventricular premature depolarization: Secondary | ICD-10-CM | POA: Diagnosis present

## 2023-03-05 DIAGNOSIS — I6350 Cerebral infarction due to unspecified occlusion or stenosis of unspecified cerebral artery: Secondary | ICD-10-CM | POA: Insufficient documentation

## 2023-03-05 DIAGNOSIS — E785 Hyperlipidemia, unspecified: Secondary | ICD-10-CM | POA: Diagnosis present

## 2023-03-05 DIAGNOSIS — I10 Essential (primary) hypertension: Secondary | ICD-10-CM | POA: Diagnosis present

## 2023-03-05 MED ORDER — NORVASC 10 MG PO TABS: 10.0000 mg | ORAL_TABLET | Freq: Every day | ORAL | 3 refills | Status: DC

## 2023-03-05 NOTE — Progress Notes (Signed)
Cardiology Office Note   Date:  03/05/2023   ID:  Samuel Mason, DOB 1951/03/17, MRN [id number]  PCP:  Sherrie Mustache, MD  Cardiologist:   Lorine Bears, MD   Chief Complaint  Patient presents with   Follow-up    6 month f/u no complaints today. Meds reviewed verbally with pt.      History of Present Illness: Samuel Mason is a 72 y.o. male who presents for a followup visit regarding hypertension, coronary artery disease and frequent asymptomatic PVCs.   He is known to have refractory hypertension with no evidence of renal artery stenosis. He also suffers from chronic chest pain likely musculoskeletal with cardiac catheterization done twice in 2009 and 2011. Both times there was mild to moderate CAD without evidence of obstructive disease. He also has known history of sleep apnea did not tolerate CPAP well. He was first noted to have frequent PVCs years ago during his sleep study and has been treated medically with a beta blocker. Most recent cardiac catheterization in August 2023 showed stable moderate mid LAD stenosis, 90% stenosis in mid RCA which was small and nondominant and occluded small OM 2 with collaterals.  He has been doing well with no chest pain.  He reports exertional dyspnea which has been relatively stable.  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  His blood pressure has been on the low side.  He cut down the dose of Coreg and eplerenone to half.   Past Medical History:  Diagnosis Date   Asymptomatic PVCs    a. 07/2012 Holter: 7965 PVCs in 48 hrs; b. 07/2015 24hr Holter: 6000 PVCs (7%).   CAD (coronary artery disease)    a. 2009 Cath: nonobs dzs; b. 02/2010 Cath: LAD 14m, D1 90, LCX 65m, RCA min irregs, EF 60%-->Med Rx; c. 07/2012 Neg ETT; d. 05/2015 Neg MV. EF 73%.   Carotid arterial disease (HCC)    a. 11/2016 Carotid u/s: mild bilat carotid plaques.   CKD (chronic kidney disease), stage III (HCC)    a. Creat 1.67 05/22/2017.    GERD (gastroesophageal reflux disease)    a. noncompliant w/ PPI.   History of echocardiogram    a. 09/2016 Echo: EF 60-65%, no orwma, mild MR, mildly dil LA/RA.   Hyperlipidemia    Hypertension    Morbid obesity (HCC)    Obstructive sleep apnea    a. noncompliant w/ CPAP.   TIA (transient ischemic attack)    Tumor of parotid gland     Past Surgical History:  Procedure Laterality Date   CARDIAC CATHETERIZATION  2011   50% mid LAD stenosis, 40% proximal LCX, EF 60%   CARDIAC CATHETERIZATION  2009   Mild CAD             COLONOSCOPY WITH PROPOFOL N/A 10/19/2018   Procedure: COLONOSCOPY WITH PROPOFOL;  Surgeon: Wyline Mood, MD;  Location: [facility] ENDOSCOPY;  Service: Gastroenterology;  Laterality: N/A;   LEFT HEART CATH AND CORONARY ANGIOGRAPHY N/A 11/27/2021   Procedure: LEFT HEART CATH AND CORONARY ANGIOGRAPHY with poss PCI;  Surgeon: Iran Ouch, MD;  Location: [facility] INVASIVE CV LAB;  Service: Cardiovascular;  Laterality: N/A;     Current Outpatient Medications  Medication Sig Dispense Refill   aspirin 81 MG tablet Take 81 mg by mouth daily.     carvedilol (COREG) 6.25 MG tablet Take 6.25 mg by mouth 2 (two) times daily.     CRESTOR 10 MG  tablet Take 10 mg by mouth as needed.  3   eplerenone (INSPRA) 50 MG tablet Take 50 mg by mouth daily.     famotidine (PEPCID) 20 MG tablet TAKE 1 TABLET (20 MG TOTAL) BY MOUTH 2 (TWO) TIMES DAILY AS NEEDED FOR HEARTBURN OR INDIGESTION. 180 tablet 1   FARXIGA 10 MG TABS tablet Take 10 mg by mouth daily.     HYZAAR 100-25 MG tablet Take 1 tablet by mouth daily.     NORVASC 10 MG tablet Take 15 mg by mouth daily.     No current facility-administered medications for this visit.    Allergies:   Simvastatin    Social History:  The patient  reports that he has never smoked. He has never used smokeless tobacco. He reports that he does not drink alcohol and does not use drugs.   Family History:  The patient's family history includes Heart attack in  his father.    ROS:  Please see the history of present illness.   Otherwise, review of systems are positive for none.   All other systems are reviewed and negative.    PHYSICAL EXAM: VS:  BP 104/60 (BP Location: Left Arm, Patient Position: Sitting, Cuff Size: Large)   Pulse (!) 59   Ht 6\' 3"  (1.905 m)   Wt 268 lb 2 oz (121.6 kg)   SpO2 98%   BMI 33.51 kg/m  , BMI Body mass index is 33.51 kg/m. GEN: Well nourished, well developed, in no acute distress  HEENT: normal  Neck: no JVD, carotid bruits, or masses Cardiac: RRR with premature beats; no murmurs, rubs, or gallops,no edema  Respiratory:  clear to auscultation bilaterally, normal work of breathing GI: soft, nontender, nondistended, + BS MS: no deformity or atrophy  Skin: warm and dry, no rash Neuro:  Strength and sensation are intact Psych: euthymic mood, full affect    EKG:  EKG is ordered today. The ekg ordered today demonstrates :  Sinus bradycardia with 1st degree A-V block with Premature atrial complexes Left axis deviation Inferior infarct , age undetermined Anteroseptal infarct , age undetermined    Recent Labs: No results found for requested labs within last 365 days.    Lipid Panel    Component Value Date/Time   CHOL 234 (HH) 06/22/2006 1553   TRIG 91 06/22/2006 1553   HDL 36.4 (L) 06/22/2006 1553   CHOLHDL 6.4 CALC 06/22/2006 1553   VLDL 18 06/22/2006 1553   LDLDIRECT 183.4 06/22/2006 1553      Wt Readings from Last 3 Encounters:  03/05/23 268 lb 2 oz (121.6 kg)  08/19/22 270 lb 12.8 oz (122.8 kg)  04/25/22 280 lb 6.4 oz (127.2 kg)         ASSESSMENT AND PLAN:  1. Asymptomatic PVCs: Symptoms are well-controlled with carvedilol.  2. Refractory hypertension: His blood pressure has been on the low side.  I decreased amlodipine to 10 mg daily.  He was on 15 mg once daily.  Otherwise, continue other medications.  He already cut down the dose of carvedilol and eplerenone due to low blood  pressure readings.  He lost 12 pounds over the last year which is likely helping his blood pressure.  3. Hyperlipidemia: I reviewed most recent lipid profile done earlier this year which showed an LDL of 134.  The patient admits that he was not taking rosuvastatin.  I discussed with him the importance of this medication especially in the setting of his known underlying chronic kidney  disease.  4.  Chronic kidney disease: Most recent creatinine was 2.0.  5.  Coronary artery disease involving native coronary arteries without angina: Currently with no anginal symptoms.  Continue medical therapy.    Disposition:   FU with me in 12 months  Signed,  Lorine Bears, MD  03/05/2023 10:54 AM    [facility]

## 2023-03-05 NOTE — Patient Instructions (Signed)
Medication Instructions:  Your physician recommends the following medication changes.  DECREASE: Amlodipine to 10 mg by mouth daily  *If you need a refill on your cardiac medications before your next appointment, please call your pharmacy*   Lab Work: No labs ordered today    Testing/Procedures: No test ordered today    Follow-Up: At [facility], you and your health needs are our priority.  As part of our continuing mission to provide you with exceptional heart care, we have created designated Provider Care Teams.  These Care Teams include your primary Cardiologist (physician) and Advanced Practice Providers (APPs -  Physician Assistants and Nurse Practitioners) who all work together to provide you with the care you need, when you need it.  We recommend signing up for the patient portal called "MyChart".  Sign up information is provided on this After Visit Summary.  MyChart is used to connect with patients for Virtual Visits (Telemedicine).  Patients are able to view lab/test results, encounter notes, upcoming appointments, etc.  Non-urgent messages can be sent to your provider as well.   To learn more about what you can do with MyChart, go to [email or web address].    Your next appointment:   1 year(s)  Provider:   You may see Lorine Bears, MD or one of the following Advanced Practice Providers on your designated Care Team:   Nicolasa Ducking, NP Eula Listen, PA-C Cadence Fransico Michael, PA-C Charlsie Quest, NP Carlos Levering, NP

## 2023-04-09 ENCOUNTER — Other Ambulatory Visit: Payer: Self-pay

## 2023-04-09 ENCOUNTER — Emergency Department
Admission: EM | Admit: 2023-04-09 | Discharge: 2023-04-10 | Disposition: A | Discharge status: Home / Self Care | Payer: Medicare Other | Attending: Emergency Medicine | Admitting: Emergency Medicine

## 2023-04-09 ENCOUNTER — Emergency Department: Payer: Medicare Other

## 2023-04-09 DIAGNOSIS — R519 Headache, unspecified: Secondary | ICD-10-CM | POA: Diagnosis present

## 2023-04-09 DIAGNOSIS — I10 Essential (primary) hypertension: Secondary | ICD-10-CM | POA: Diagnosis not present

## 2023-04-09 LAB — CBC
HCT: 43.7 % (ref 39.0–52.0)
Hemoglobin: 14.7 g/dL (ref 13.0–17.0)
MCH: 30.3 pg (ref 26.0–34.0)
MCHC: 33.6 g/dL (ref 30.0–36.0)
MCV: 90.1 fL (ref 80.0–100.0)
Platelets: 269 10*3/uL (ref 150–400)
RBC: 4.85 MIL/uL (ref 4.22–5.81)
RDW: 12.4 % (ref 11.5–15.5)
WBC: 6.1 10*3/uL (ref 4.0–10.5)
nRBC: 0 % (ref 0.0–0.2)

## 2023-04-09 LAB — COMPREHENSIVE METABOLIC PANEL
ALT: 22 U/L (ref 0–44)
AST: 26 U/L (ref 15–41)
Albumin: 4 g/dL (ref 3.5–5.0)
Alkaline Phosphatase: 76 U/L (ref 38–126)
Anion gap: 11 (ref 5–15)
BUN: 20 mg/dL (ref 8–23)
CO2: 26 mmol/L (ref 22–32)
Calcium: 9.1 mg/dL (ref 8.9–10.3)
Chloride: 100 mmol/L (ref 98–111)
Creatinine, Ser: 1.47 mg/dL — ABNORMAL HIGH (ref 0.61–1.24)
GFR, Estimated: 51 mL/min — ABNORMAL LOW (ref >60)
Glucose, Bld: 107 mg/dL — ABNORMAL HIGH (ref 70–99)
Potassium: 3.5 mmol/L (ref 3.5–5.1)
Sodium: 137 mmol/L (ref 135–145)
Total Bilirubin: 0.7 mg/dL (ref <1.2)
Total Protein: 8.3 g/dL — ABNORMAL HIGH (ref 6.5–8.1)

## 2023-04-09 LAB — TROPONIN I (HIGH SENSITIVITY): Troponin I (High Sensitivity): <2 ng/L (ref <18)

## 2023-04-09 NOTE — ED Triage Notes (Signed)
Pt reports HTN at home with 160's/100's. Pt reports dizziness yesterday but none today. Pt denies chest pain or pressure. Denies h/a, vision change, parasthesia, n/v. Pt reports doctor recently changed his bp medications and he has been taking all medications as prescribed. Denies any missed doses. Pt is ambulatory to triage. Alert and oriented. Breathing unlabored speaking in full sentences with symmetric chest rise and fall.

## 2023-04-09 NOTE — ED Provider Notes (Signed)
[facility] Provider Note    Event Date/Time   First MD Initiated Contact with Patient 04/09/23 2252     (approximate)   History   Hypertension   HPI Samuel Mason is a 72 y.o. male with a history of hypertension who presents with concerns for high blood pressure readings at home for the last 2 days.  He has been asymptomatic.  He said occasionally he will have headaches but he has no other symptoms including no chest pain, shortness of breath, urinary habit changes, etc.  He is just concerned because about a month ago his primary care doctor made some changes to his medication regimen and he has been adherent to the regimen and recording his blood pressure readings at home.  Over the last 2 days they have been higher than usual, as high as 160/100+ which worried him greatly and prompted him to come to the emergency department.     Physical Exam   Triage Vital Signs: ED Triage Vitals  Encounter Vitals Group     BP 04/09/23 1907 (!) 160/103     Systolic BP Percentile --      Diastolic BP Percentile --      Pulse Rate 04/09/23 1907 66     Resp 04/09/23 1907 18     Temp 04/09/23 1907 97.6 F (36.4 C)     Temp Source 04/09/23 1907 Oral     SpO2 04/09/23 1907 99 %     Weight 04/09/23 1907 119.7 kg (264 lb)     Height 04/09/23 1907 1.88 m (6\' 2" )     Head Circumference --      Peak Flow --      Pain Score 04/09/23 1908 0     Pain Loc --      Pain Education --      Exclude from Growth Chart --     Most recent vital signs: Vitals:   04/09/23 2241 04/09/23 2345  BP:  (!) 166/104  Pulse: 64 (!) 50  Resp: 18   Temp:    SpO2: 100% 100%    General: Awake, no distress.  CV:  Good peripheral perfusion.  Normal heart sounds, regular rate and rhythm. Resp:  Normal effort. Speaking easily and comfortably, no accessory muscle usage nor intercostal retractions.  Lungs are clear to auscultation.  No wheezing. Abd:  No distention.  No tenderness to  palpation. Other:  Normal mental status.   ED Results / Procedures / Treatments   Labs (all labs ordered are listed, but only abnormal results are displayed) Labs Reviewed  COMPREHENSIVE METABOLIC PANEL - Abnormal; Notable for the following components:      Result Value   Glucose, Bld 107 (*)    Creatinine, Ser 1.47 (*)    Total Protein 8.3 (*)    GFR, Estimated 51 (*)    All other components within normal limits  CBC  TROPONIN I (HIGH SENSITIVITY)     EKG  ED ECG REPORT I, Loleta Rose, the attending physician, personally viewed and interpreted this ECG.  Date: 04/09/2023 EKG Time: 19: 11 Rate: 62 Rhythm: Sinus rhythm with first-degree AV block and occasional PVC and PAC QRS Axis: Left axis deviation Intervals: normal ST/T Wave abnormalities: Non-specific ST segment / T-wave changes, but no clear evidence of acute ischemia. Narrative Interpretation: no definitive evidence of acute ischemia; does not meet STEMI criteria.    RADIOLOGY I viewed and interpreted the patient's two-view chest x-ray.  I see  no evidence of pneumonia or widened mediastinum.  I also read the radiologist's report, which confirmed no acute findings.   PROCEDURES:  Critical Care performed: No  Procedures    IMPRESSION / MDM / ASSESSMENT AND PLAN / ED COURSE  I reviewed the triage vital signs and the nursing notes.                              Differential diagnosis includes, but is not limited to, asymptomatic hypertension, renal dysfunction, ACS, hypertensive urgency/emergency.  Patient's presentation is most consistent with exacerbation of chronic illness.  Labs/studies ordered: EKG, two-view chest x-ray, high-sensitivity troponin, CMP, CBC  Interventions/Medications given:  Medications - No data to display  (Note:  hospital course my include additional interventions and/or labs/studies not listed above.)   Patient is asymptomatic.  His blood pressure was elevated at triage and  remained consistent at about 166/100 when I evaluated him, but he is compliant with his medications and has both a primary care doctor and a cardiologist.  Evaluation is all very reassuring including chronic kidney disease that is actually better than it has been in the past.  Normal high-sensitivity troponin.  I had an extensive conversation with the patient and his wife to provide reassurance and explained that aggressive intervention in the emergency department setting is unnecessary for asymptomatic hypertension.  I encouraged him to continue taking his medications as prescribed and follow-up with his primary care doctor at the next available opportunity.  He and his wife remain concerned but seem to feel little bit better after my usual and customary discussion about hypertension.       FINAL CLINICAL IMPRESSION(S) / ED DIAGNOSES   Final diagnoses:  Uncontrolled hypertension     Rx / DC Orders   ED Discharge Orders     None        Note:  This document was prepared using Dragon voice recognition software and may include unintentional dictation errors.   Loleta Rose, MD 04/09/23 2358

## 2023-04-09 NOTE — Discharge Instructions (Addendum)
As we discussed, though you do have high blood pressure (hypertension), fortunately it is not immediately dangerous at this time and does not need emergency intervention or admission to the hospital.  If we add to or change your regular medications, we may cause more harm than good - it is more appropriate for your primary care doctor to evaluate you in clinic and decide if any medication changes are needed.  Please follow up in clinic as recommended in these papers.   ° °Return to the Emergency Department (ED) if you experience any worsening chest pain/pressure/tightness, difficulty breathing, or sudden sweating, or other symptoms that concern you. °

## 2023-05-01 ENCOUNTER — Other Ambulatory Visit: Payer: Self-pay | Admitting: Neurology

## 2023-05-01 DIAGNOSIS — D352 Benign neoplasm of pituitary gland: Secondary | ICD-10-CM

## 2023-05-08 ENCOUNTER — Ambulatory Visit: Payer: Medicare Other | Attending: Medical | Admitting: Medical

## 2023-05-08 ENCOUNTER — Encounter: Payer: Self-pay | Admitting: Medical

## 2023-05-08 VITALS — BP 132/70 | HR 67 | Ht 75.0 in | Wt 270.0 lb

## 2023-05-08 DIAGNOSIS — I998 Other disorder of circulatory system: Secondary | ICD-10-CM

## 2023-05-08 DIAGNOSIS — I10 Essential (primary) hypertension: Secondary | ICD-10-CM

## 2023-05-08 DIAGNOSIS — I771 Stricture of artery: Secondary | ICD-10-CM

## 2023-05-08 DIAGNOSIS — E782 Mixed hyperlipidemia: Secondary | ICD-10-CM

## 2023-05-08 DIAGNOSIS — N183 Chronic kidney disease, stage 3 unspecified: Secondary | ICD-10-CM | POA: Diagnosis present

## 2023-05-08 DIAGNOSIS — I493 Ventricular premature depolarization: Secondary | ICD-10-CM

## 2023-05-08 NOTE — Patient Instructions (Signed)
Medication Instructions:  Your physician recommends that you continue on your current medications as directed. Please refer to the Current Medication list given to you today.   *If you need a refill on your cardiac medications before your next appointment, please call your pharmacy*   Lab Work: No labs ordered today    Testing/Procedures: Your physician has requested that you have a carotid duplex. This test is an ultrasound of the carotid arteries in your neck. It looks at blood flow through these arteries that supply the brain with blood.   Allow one hour for this exam.  There are no restrictions or special instructions.  This will take place at [address] [facility] [address], [place] [address]  Please note: We ask at that you not bring children with you during ultrasound (echo/ vascular) testing. Due to room size and safety concerns, children are not allowed in the ultrasound rooms during exams. Our front office staff cannot provide observation of children in our lobby area while testing is being conducted. An adult accompanying a patient to their appointment will only be allowed in the ultrasound room at the discretion of the ultrasound technician under special circumstances. We apologize for any inconvenience.    Follow-Up: At [facility], you and your health needs are our priority.  As part of our continuing mission to provide you with exceptional heart care, we have created designated Provider Care Teams.  These Care Teams include your primary Cardiologist (physician) and Advanced Practice Providers (APPs -  Physician Assistants and Nurse Practitioners) who all work together to provide you with the care you need, when you need it.  We recommend signing up for the patient portal called "MyChart".  Sign up information is provided on this After Visit Summary.  MyChart is used to connect with patients for Virtual Visits (Telemedicine).  Patients are able to view  lab/test results, encounter notes, upcoming appointments, etc.  Non-urgent messages can be sent to your provider as well.   To learn more about what you can do with MyChart, go to [email or web address].    Your next appointment:   3 month(s)  Provider:   You may see Lorine Bears, MD or one of the following Advanced Practice Providers on your designated Care Team:   Nicolasa Ducking, NP Eula Listen, PA-C Cadence Fransico Michael, PA-C Charlsie Quest, NP Carlos Levering, NP

## 2023-05-08 NOTE — Progress Notes (Unsigned)
Cardiology Office Note:    Date:  05/09/2023   ID:  Caleen Essex, DOB 03-18-1951, MRN [id number]  PCP:  Sherrie Mustache, MD  [facility] Cardiologist:  Lorine Bears, MD  [facility] Electrophysiologist:  None   Referring MD: Sherrie Mustache, MD   Chief Complaint: ER follow-up  History of Present Illness:    DRESHON SCHEY is a 73 y.o. male with a hx of hypertension, CKD stage 3, coronary artery disease and frequent PVCs who presents for follow-up.  Patient has refractory hypertension with no evidence of renal artery stenosis.  He suffers from chronic chest pain likely musculoskeletal.  Cardiac cath done in 2009 and 2011, both times they were mild to moderate CAD without evidence of obstructive disease.  He has known history of OSA but did not tolerate CPAP.  He was noted to have frequent PVCs during his sleep study and has been treated medically with a beta-blocker.  Most recent cardiac cath in August 2023 showed stable moderate mid LAD stenosis, 90% stenosis in mid RCA which was small and nondominant and occluded small OM 2 with collaterals.  Patient was last seen in November 2024 and was doing well with no chest pain.  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  Blood pressure was normal.  The patient went to the ER in December 2024 for high blood pressure. Blood pressure medications had previously been changed for low BP (amlodipine was stopped). Work up was unremarkable. He later saw his PCP and amlodipine was restarted.   Today, the patient reports his BP has been much better. He denies chest pain, SOB, lightheadedness, dizziness, lower leg edema. He has noticed that the BP in the Left arm>right arm.   Past Medical History:  Diagnosis Date   Asymptomatic PVCs    a. 07/2012 Holter: 7965 PVCs in 48 hrs; b. 07/2015 24hr Holter: 6000 PVCs (7%).   CAD (coronary artery disease)    a. 2009 Cath: nonobs dzs; b. 02/2010 Cath: LAD 81m, D1 90, LCX 44m, RCA min irregs,  EF 60%-->Med Rx; c. 07/2012 Neg ETT; d. 05/2015 Neg MV. EF 73%.   Carotid arterial disease (HCC)    a. 11/2016 Carotid u/s: mild bilat carotid plaques.   CKD (chronic kidney disease), stage III (HCC)    a. Creat 1.67 05/22/2017.   GERD (gastroesophageal reflux disease)    a. noncompliant w/ PPI.   History of echocardiogram    a. 09/2016 Echo: EF 60-65%, no orwma, mild MR, mildly dil LA/RA.   Hyperlipidemia    Hypertension    Morbid obesity (HCC)    Obstructive sleep apnea    a. noncompliant w/ CPAP.   TIA (transient ischemic attack)    Tumor of parotid gland     Past Surgical History:  Procedure Laterality Date   CARDIAC CATHETERIZATION  2011   50% mid LAD stenosis, 40% proximal LCX, EF 60%   CARDIAC CATHETERIZATION  2009   Mild CAD             COLONOSCOPY WITH PROPOFOL N/A 10/19/2018   Procedure: COLONOSCOPY WITH PROPOFOL;  Surgeon: Wyline Mood, MD;  Location[facility] ENDOSCOPY;  Service: Gastroenterology;  Laterality: N/A;   LEFT HEART CATH AND CORONARY ANGIOGRAPHY N/A 11/27/2021   Procedure: LEFT HEART CATH AND CORONARY ANGIOGRAPHY with poss PCI;  Surgeon: Iran Ouch, MD;  Location: [facility] INVASIVE CV LAB;  Service: Cardiovascular;  Laterality: N/A;    Current Medications: Current Meds  Medication Sig   aspirin  81 MG tablet Take 81 mg by mouth daily.   carvedilol (COREG) 6.25 MG tablet Take 6.25 mg by mouth 2 (two) times daily.   CRESTOR 10 MG tablet Take 10 mg by mouth daily.   eplerenone (INSPRA) 50 MG tablet Take 25 mg by mouth daily.   famotidine (PEPCID) 20 MG tablet TAKE 1 TABLET (20 MG TOTAL) BY MOUTH 2 (TWO) TIMES DAILY AS NEEDED FOR HEARTBURN OR INDIGESTION.   FARXIGA 10 MG TABS tablet Take 10 mg by mouth daily.   HYZAAR 100-25 MG tablet Take 1 tablet by mouth daily.   NORVASC 10 MG tablet Take 1 tablet (10 mg total) by mouth daily.     Allergies:   Simvastatin   Social History   Socioeconomic History   Marital status: Married    Spouse name: Zella Ball   Number of  children: 2   Years of education: Not on file   Highest education level: Not on file  Occupational History   Not on file  Tobacco Use   Smoking status: Never   Smokeless tobacco: Never  Vaping Use   Vaping status: Never Used  Substance and Sexual Activity   Alcohol use: No    Comment: social  drinker   Drug use: No   Sexual activity: Not on file  Other Topics Concern   Not on file  Social History Narrative   Lives at home with wife; kids out the house: 1 in [place], 1 in [place].    Social Drivers of Corporate investment banker Strain: Low Risk  (02/12/2023)   Received from [facility]   Overall Financial Resource Strain (CARDIA)    Difficulty of Paying Living Expenses: Not hard at all  Food Insecurity: No Food Insecurity (02/12/2023)   Received from [facility]   Hunger Vital Sign    Worried About Running Out of Food in the Last Year: Never true    Ran Out of Food in the Last Year: Never true  Transportation Needs: No Transportation Needs (02/12/2023)   Received from [facility] - Transportation    In the past 12 months, has lack of transportation kept you from medical appointments or from getting medications?: No    Lack of Transportation (Non-Medical): No  Physical Activity: Not on file  Stress: Not on file  Social Connections: Not on file     Family History: The patient's family history includes Heart attack in his father.  ROS:   Please see the history of present illness.     All other systems reviewed and are negative.  EKGs/Labs/Other Studies Reviewed:    The following studies were reviewed today:  LHC 11/2021    Prox Cx to Mid Cx lesion is 30% stenosed.   Ost LAD to Prox LAD lesion is 20% stenosed.   Prox RCA to Mid RCA lesion is 90% stenosed.   RV Branch lesion is 80% stenosed.   2nd Mrg lesion is 100% stenosed.   Mid LAD lesion is 60% stenosed.   1.  Stable moderate mid LAD stenosis  which was present before.  There is new 90% stenosis in the mid RCA involving the bifurcation of an RV branch.  However, the vessel is small and nondominant.  In addition, OM 2 seems to be occluded with collaterals.  The vessel is small in size. 2.  Left ventricular angiography was not performed due to chronic kidney disease. 3.  Normal LV end-diastolic pressure.  Recommendations: The patient does have coronary artery disease which does not require revascularization.  Recommend continued aggressive medical therapy. 35 mL of contrast was used for the procedure.   Echo 09/2021 1. Left ventricular ejection fraction, by estimation, is 60 to 65%. The  left ventricle has normal function. The left ventricle has no regional  wall motion abnormalities. There is mild left ventricular hypertrophy.  Left ventricular diastolic parameters  are indeterminate.   2. Right ventricular systolic function is normal. The right ventricular  size is normal. There is normal pulmonary artery systolic pressure. The  estimated right ventricular systolic pressure is 18.2 mmHg.   3. Left atrial size was mildly dilated.   4. The mitral valve is normal in structure. Mild to moderate mitral valve  regurgitation. No evidence of mitral stenosis.   5. The aortic valve is normal in structure. Aortic valve regurgitation is  not visualized. Aortic valve sclerosis is present, with no evidence of  aortic valve stenosis.   6. The inferior vena cava is normal in size with greater than 50%  respiratory variability, suggesting right atrial pressure of 3 mmHg.    Heart monitor 09/2021 Patch Wear Time:  8 days and 2 hours (2023-05-31T13:17:37-0400 to 2023-06-08T16:05:18-0400)   Patient had a min HR of 39 bpm, max HR of 171 bpm, and avg HR of 66 bpm. Predominant underlying rhythm was Sinus Rhythm. First Degree AV Block was present.  18 Ventricular Tachycardia runs occurred, the run with the fastest interval lasting 5 beats with a max  rate of 171 bpm, the longest lasting 8 beats with an avg rate of 137 bpm.  1534 Supraventricular Tachycardia runs occurred, the run with the fastest interval lasting 4 beats with a max rate of 167 bpm, the longest lasting 1 min 0 secs with an avg rate of 92 bpm. Supraventricular Tachycardia was detected within +/- 45 seconds of symptomatic patient event(s). Frequent PACs with a burden of 6%. Frequent PVCs with a burden of 6.1%.    EKG:  EKG is not ordered today.    Recent Labs: 04/09/2023: ALT 22; BUN 20; Creatinine, Ser 1.47; Hemoglobin 14.7; Platelets 269; Potassium 3.5; Sodium 137  Recent Lipid Panel    Component Value Date/Time   CHOL 234 (HH) 06/22/2006 1553   TRIG 91 06/22/2006 1553   HDL 36.4 (L) 06/22/2006 1553   CHOLHDL 6.4 CALC 06/22/2006 1553   VLDL 18 06/22/2006 1553   LDLDIRECT 183.4 06/22/2006 1553    Physical Exam:    VS:  BP 132/70 (BP Location: Left Arm)   Pulse 67   Ht 6\' 3"  (1.905 m)   Wt 270 lb (122.5 kg)   SpO2 97%   BMI 33.75 kg/m     Wt Readings from Last 3 Encounters:  05/08/23 270 lb (122.5 kg)  04/09/23 264 lb (119.7 kg)  03/05/23 268 lb 2 oz (121.6 kg)     GEN:  Well nourished, well developed in no acute distress HEENT: Normal NECK: No JVD; No carotid bruits LYMPHATICS: No lymphadenopathy CARDIAC: RRR, no murmurs, rubs, gallops RESPIRATORY:  Clear to auscultation without rales, wheezing or rhonchi  ABDOMEN: Soft, non-tender, non-distended MUSCULOSKELETAL:  No edema; No deformity  SKIN: Warm and dry NEUROLOGIC:  Alert and oriented x 3 PSYCHIATRIC:  Normal affect   ASSESSMENT:    1. Essential hypertension   2. Asymmetric blood pressures   3. Subclavian artery stenosis (HCC)   4. Frequent PVCs   5. Hyperlipidemia, mixed   6. Stage 3 chronic  kidney disease, unspecified whether stage 3a or 3b CKD (HCC)    PLAN:    In order of problems listed above:  Refractory HTN/asymmetric blood pressures Recent ER visit follow-up for uncontrolled  HTN with BP 160/103. Amlodipine was previously stopped, but now he is back on it. BP today 110/68. Patient reports indiscretion in blood pressures from left to right, L>R. Left arm 110/68 and right arm 130/80. I will check a carotid US for carotid artery stenosis. Continue amlodipine 10mg  daily, Coreg 6.25mg  BID, Losartan hydrochlorothiazide 100-25mg  daily.  Nonobstructive CAD No anginal symptoms reported. No ischemic work-up indicated. Continue ASA, Coreg, Crestor   Asymptomatic PVCs Continue Coreg 6.25mg BID.  HLD LDL 134. Continue Crestor 10mg  daily  CKD stage 3 Scr 1.47 on most recent labs.    Disposition: Follow up in 3 month(s) with MD/APP    Signed, Maiah Sinning David Stall, PA-C  05/09/2023 2:37 PM    [facility]

## 2023-05-14 ENCOUNTER — Ambulatory Visit: Payer: Medicare Other | Admitting: Podiatry

## 2023-05-14 DIAGNOSIS — Z91198 Patient's noncompliance with other medical treatment and regimen for other reason: Secondary | ICD-10-CM

## 2023-05-21 ENCOUNTER — Ambulatory Visit: Payer: Medicare Other | Attending: Medical

## 2023-05-21 DIAGNOSIS — I771 Stricture of artery: Secondary | ICD-10-CM | POA: Insufficient documentation

## 2023-05-21 DIAGNOSIS — G451 Carotid artery syndrome (hemispheric): Secondary | ICD-10-CM

## 2023-05-21 NOTE — Progress Notes (Signed)
1. Failure to attend appointment with reason given    Patient called to reschedule.

## 2023-05-25 ENCOUNTER — Other Ambulatory Visit: Payer: Self-pay

## 2023-05-25 ENCOUNTER — Telehealth: Payer: Self-pay | Admitting: Cardiovascular Disease

## 2023-05-25 NOTE — Telephone Encounter (Signed)
Patient is returning RN's call for his Carotid results. Please advise.

## 2023-05-26 ENCOUNTER — Ambulatory Visit: Admission: RE | Admit: 2023-05-26 | Payer: Medicare Other | Source: Ambulatory Visit

## 2023-06-01 ENCOUNTER — Telehealth: Payer: Self-pay

## 2023-06-01 NOTE — Telephone Encounter (Signed)
 Spoken to patient and inform him that we do not have the schedule up for June. Recall letter is set to call patient in May.

## 2023-06-01 NOTE — Telephone Encounter (Signed)
 Patient is calling because he states he received a letter that he is due for his repeat colonoscopy. Please call patient back to schedule

## 2023-06-16 ENCOUNTER — Ambulatory Visit: Payer: Medicare Other | Admitting: Podiatry

## 2023-06-19 ENCOUNTER — Other Ambulatory Visit (HOSPITAL_BASED_OUTPATIENT_CLINIC_OR_DEPARTMENT_OTHER): Payer: Self-pay | Admitting: Pulmonary Disease

## 2023-06-22 ENCOUNTER — Ambulatory Visit (INDEPENDENT_AMBULATORY_CARE_PROVIDER_SITE_OTHER): Payer: Medicare Other | Admitting: Podiatry

## 2023-06-22 ENCOUNTER — Encounter: Payer: Self-pay | Admitting: Podiatry

## 2023-06-22 DIAGNOSIS — B351 Tinea unguium: Secondary | ICD-10-CM

## 2023-06-22 DIAGNOSIS — M79675 Pain in left toe(s): Secondary | ICD-10-CM

## 2023-06-22 DIAGNOSIS — M79674 Pain in right toe(s): Secondary | ICD-10-CM | POA: Diagnosis not present

## 2023-06-22 NOTE — Progress Notes (Signed)
 This patient returns to my office for at risk foot care.  This patient requires this care by a professional since this patient will be at risk due to having PAD and chronic kidney disease  This patient is unable to cut nails himself since the patient cannot reach his nails.These nails are painful walking and wearing shoes.  This patient presents for at risk foot care today.  General Appearance  Alert, conversant and in no acute stress.  Vascular  Dorsalis pedis and posterior tibial  pulses are palpable  bilaterally.  Capillary return is within normal limits  bilaterally. Temperature is within normal limits  bilaterally.  Neurologic  Senn-Weinstein monofilament wire test within normal limits  bilaterally. Muscle power within normal limits bilaterally.  Nails Thick disfigured discolored nails with subungual debris  from hallux to fifth toes bilaterally. No evidence of bacterial infection or drainage bilaterally.  Orthopedic  No limitations of motion  feet .  No crepitus or effusions noted.  No bony pathology or digital deformities noted.  Skin  normotropic skin with no porokeratosis noted bilaterally.  No signs of infections or ulcers noted.     Onychomycosis  Pain in right toes  Pain in left toes  Consent was obtained for treatment procedures.   Mechanical debridement of nails 1-5  bilaterally performed with a nail nipper.  Filed with dremel without incident.    Return office visit    6  months                 Told patient to return for periodic foot care and evaluation due to potential at risk complications.   Helane Gunther DPM

## 2023-06-30 ENCOUNTER — Ambulatory Visit
Admission: RE | Admit: 2023-06-30 | Discharge: 2023-06-30 | Disposition: A | Discharge status: Home / Self Care | Payer: Medicare Other | Source: Ambulatory Visit | Attending: Neurology | Admitting: Neurology

## 2023-06-30 DIAGNOSIS — D352 Benign neoplasm of pituitary gland: Secondary | ICD-10-CM | POA: Diagnosis present

## 2023-06-30 MED ORDER — GADOBUTROL 1 MMOL/ML IV SOLN
7.5000 mL | Freq: Once | INTRAVENOUS | Status: AC | PRN
  Administered 2023-06-30: 7.5 mL via INTRAVENOUS

## 2023-07-07 LAB — LAB REPORT - SCANNED
Calcium: 9.2
EGFR: 39

## 2023-07-10 ENCOUNTER — Ambulatory Visit: Payer: Medicare Other | Attending: Cardiovascular Disease | Admitting: Cardiovascular Disease

## 2023-07-10 ENCOUNTER — Encounter: Payer: Self-pay | Admitting: Cardiovascular Disease

## 2023-07-10 VITALS — BP 150/78 | HR 61 | Ht 74.5 in | Wt 272.6 lb

## 2023-07-10 DIAGNOSIS — I2510 Atherosclerotic heart disease of native coronary artery without angina pectoris: Secondary | ICD-10-CM | POA: Insufficient documentation

## 2023-07-10 DIAGNOSIS — E785 Hyperlipidemia, unspecified: Secondary | ICD-10-CM | POA: Insufficient documentation

## 2023-07-10 DIAGNOSIS — I493 Ventricular premature depolarization: Secondary | ICD-10-CM | POA: Insufficient documentation

## 2023-07-10 NOTE — Progress Notes (Signed)
 Cardiology Office Note   Date:  07/10/2023   ID:  Samuel Mason, DOB 1950/07/09, MRN [id number]  PCP:  Sherrie Mustache, MD  Cardiologist:   Lorine Bears, MD   Chief Complaint  Patient presents with   Follow-up    3 month follow up visit. Patient states that he had an emergency about 3 months ago in regards with his blood pressure. Patient is doing well on today. Meds reviewed.       History of Present Illness: Samuel Mason is a 73 y.o. male who presents for a followup visit regarding hypertension, coronary artery disease and frequent asymptomatic PVCs.   He is known to have refractory hypertension with no evidence of renal artery stenosis. He also suffers from chronic chest pain likely musculoskeletal with cardiac catheterization done twice in 2009 and 2011. Both times there was mild to moderate CAD without evidence of obstructive disease. He also has known history of sleep apnea did not tolerate CPAP well. He was first noted to have frequent PVCs years ago during his sleep study and has been treated medically with a beta blocker. Most recent cardiac catheterization in August 2023 showed stable moderate mid LAD stenosis, 90% stenosis in mid RCA which was small and nondominant and occluded small OM 2 with collaterals.  He cut down on antihypertensive medications in late 2024 as he was losing weight.  Subsequently, he had an emergency room visit for elevated blood pressure.  There was concern about discrepancy in blood pressure between right and left arm.  Carotid Doppler showed near equal blood pressure in both arms with no convincing evidence of subclavian artery stenosis.  He is doing well now with no chest pain, shortness of breath or palpitations.  He has been checking his blood pressure at home daily and his blood pressure is controlled.  Past Medical History:  Diagnosis Date   Asymptomatic PVCs    a. 07/2012 Holter: 7965 PVCs in 48 hrs; b. 07/2015 24hr Holter: 6000 PVCs (7%).    CAD (coronary artery disease)    a. 2009 Cath: nonobs dzs; b. 02/2010 Cath: LAD 38m, D1 90, LCX 60m, RCA min irregs, EF 60%-->Med Rx; c. 07/2012 Neg ETT; d. 05/2015 Neg MV. EF 73%.   Carotid arterial disease (HCC)    a. 11/2016 Carotid u/s: mild bilat carotid plaques.   CKD (chronic kidney disease), stage III (HCC)    a. Creat 1.67 05/22/2017.   GERD (gastroesophageal reflux disease)    a. noncompliant w/ PPI.   History of echocardiogram    a. 09/2016 Echo: EF 60-65%, no orwma, mild MR, mildly dil LA/RA.   Hyperlipidemia    Hypertension    Morbid obesity (HCC)    Obstructive sleep apnea    a. noncompliant w/ CPAP.   TIA (transient ischemic attack)    Tumor of parotid gland     Past Surgical History:  Procedure Laterality Date   CARDIAC CATHETERIZATION  2011   50% mid LAD stenosis, 40% proximal LCX, EF 60%   CARDIAC CATHETERIZATION  2009   Mild CAD             COLONOSCOPY WITH PROPOFOL N/A 10/19/2018   Procedure: COLONOSCOPY WITH PROPOFOL;  Surgeon: Wyline Mood, MD;  Location: [facility] ENDOSCOPY;  Service: Gastroenterology;  Laterality: N/A;   LEFT HEART CATH AND CORONARY ANGIOGRAPHY N/A 11/27/2021   Procedure: LEFT HEART CATH AND CORONARY ANGIOGRAPHY with poss PCI;  Surgeon: Iran Ouch, MD;  Location: [facility] INVASIVE CV LAB;  Service: Cardiovascular;  Laterality: N/A;     Current Outpatient Medications  Medication Sig Dispense Refill   aspirin 81 MG tablet Take 81 mg by mouth daily.     carvedilol (COREG) 6.25 MG tablet Take 6.25 mg by mouth 2 (two) times daily.     CRESTOR 10 MG tablet Take 10 mg by mouth daily.  3   eplerenone (INSPRA) 50 MG tablet Take 25 mg by mouth daily.     famotidine (PEPCID) 20 MG tablet TAKE 1 TABLET (20 MG TOTAL) BY MOUTH 2 (TWO) TIMES DAILY AS NEEDED FOR HEARTBURN OR INDIGESTION. 180 tablet 1   FARXIGA 10 MG TABS tablet Take 10 mg by mouth daily.     HYZAAR 100-25 MG tablet Take 1 tablet by mouth daily.     NORVASC 10 MG tablet Take 1 tablet (10 mg total)  by mouth daily. 90 tablet 3   No current facility-administered medications for this visit.    Allergies:   Simvastatin    Social History:  The patient  reports that he has never smoked. He has never used smokeless tobacco. He reports that he does not drink alcohol and does not use drugs.   Family History:  The patient's family history includes Heart attack in his father.    ROS:  Please see the history of present illness.   Otherwise, review of systems are positive for none.   All other systems are reviewed and negative.    PHYSICAL EXAM: VS:  BP (!) 150/78   Pulse 61   Ht 6' 2.5" (1.892 m)   Wt 272 lb 9.6 oz (123.7 kg)   SpO2 99%   BMI 34.53 kg/m  , BMI Body mass index is 34.53 kg/m. GEN: Well nourished, well developed, in no acute distress  HEENT: normal  Neck: no JVD, carotid bruits, or masses Cardiac: RRR with premature beats; no murmurs, rubs, or gallops,no edema  Respiratory:  clear to auscultation bilaterally, normal work of breathing GI: soft, nontender, nondistended, + BS MS: no deformity or atrophy  Skin: warm and dry, no rash Neuro:  Strength and sensation are intact Psych: euthymic mood, full affect    EKG:  EKG is not ordered today.   Recent Labs: 04/09/2023: ALT 22; BUN 20; Creatinine, Ser 1.47; Hemoglobin 14.7; Platelets 269; Potassium 3.5; Sodium 137    Lipid Panel    Component Value Date/Time   CHOL 234 (HH) 06/22/2006 1553   TRIG 91 06/22/2006 1553   HDL 36.4 (L) 06/22/2006 1553   CHOLHDL 6.4 CALC 06/22/2006 1553   VLDL 18 06/22/2006 1553   LDLDIRECT 183.4 06/22/2006 1553      Wt Readings from Last 3 Encounters:  07/10/23 272 lb 9.6 oz (123.7 kg)  05/08/23 270 lb (122.5 kg)  06/30/23 270 lb (122.5 kg)         ASSESSMENT AND PLAN:  1. Asymptomatic PVCs: Symptoms are well-controlled with carvedilol.  2. Refractory hypertension: Blood pressure is now well-controlled on current medications especially his home blood pressure  readings.  I made no changes.  I do not think the difference in blood pressure between both arms is clinically significant.  He has equal pulses in both arms.  3. Hyperlipidemia: Continue treatment with rosuvastatin with a target LDL of less than 70.  4.  Chronic kidney disease: Most recent creatinine was 1.47.  5.  Coronary artery disease involving native coronary arteries without angina: Currently with no anginal symptoms.  Continue medical therapy.    Disposition:  FU with me in 6 months  Signed,  Lorine Bears, MD  07/10/2023 9:10 AM    [facility]

## 2023-07-10 NOTE — Patient Instructions (Signed)
 Medication Instructions:  No changes *If you need a refill on your cardiac medications before your next appointment, please call your pharmacy*   Lab Work: None ordered If you have labs (blood work) drawn today and your tests are completely normal, you will receive your results only by: MyChart Message (if you have MyChart) OR A paper copy in the mail If you have any lab test that is abnormal or we need to change your treatment, we will call you to review the results.   Testing/Procedures: None ordered   Follow-Up: At [facility], you and your health needs are our priority.  As part of our continuing mission to provide you with exceptional heart care, we have created designated Provider Care Teams.  These Care Teams include your primary Cardiologist (physician) and Advanced Practice Providers (APPs -  Physician Assistants and Nurse Practitioners) who all work together to provide you with the care you need, when you need it.  We recommend signing up for the patient portal called "MyChart".  Sign up information is provided on this After Visit Summary.  MyChart is used to connect with patients for Virtual Visits (Telemedicine).  Patients are able to view lab/test results, encounter notes, upcoming appointments, etc.  Non-urgent messages can be sent to your provider as well.   To learn more about what you can do with MyChart, go to [email or web address].    Your next appointment:   6 month(s)  Provider:   You may see Lorine Bears, MD or one of the following Advanced Practice Providers on your designated Care Team:   Nicolasa Ducking, NP Eula Listen, PA-C Cadence Fransico Michael, PA-C Charlsie Quest, NP Carlos Levering, NP

## 2023-08-13 ENCOUNTER — Ambulatory Visit: Payer: Medicare Other | Admitting: Podiatry

## 2023-09-23 ENCOUNTER — Other Ambulatory Visit: Payer: Self-pay | Admitting: Cardiovascular Disease

## 2023-09-24 ENCOUNTER — Other Ambulatory Visit: Payer: Self-pay | Admitting: *Deleted

## 2023-09-24 ENCOUNTER — Other Ambulatory Visit: Payer: Self-pay | Admitting: Cardiovascular Disease

## 2023-09-24 MED ORDER — HYZAAR 100-25 MG PO TABS: 1.0000 | ORAL_TABLET | Freq: Every day | ORAL | 1 refills | Status: DC

## 2023-09-24 MED ORDER — EPLERENONE 25 MG PO TABS: 25.0000 mg | ORAL_TABLET | Freq: Every day | ORAL | 1 refills | Status: DC

## 2023-09-24 MED ORDER — CARVEDILOL 6.25 MG PO TABS: 6.2500 mg | ORAL_TABLET | Freq: Two times a day (BID) | ORAL | 1 refills | Status: DC

## 2023-09-24 MED ORDER — NORVASC 10 MG PO TABS: 10.0000 mg | ORAL_TABLET | Freq: Every day | ORAL | 1 refills | Status: DC

## 2023-09-28 ENCOUNTER — Telehealth: Payer: Self-pay

## 2023-09-28 NOTE — Telephone Encounter (Signed)
 Pt requesting call back to schedule colonoscopy.

## 2023-09-29 ENCOUNTER — Telehealth: Payer: Self-pay

## 2023-09-29 NOTE — Telephone Encounter (Signed)
 Patient will be due for his repeat colonoscopy with Dr. Antony Baumgartner after 10/19/23.  He would like to have his colonoscopy with Dr. Antony Baumgartner at [facility]. Informed him that I would message [facility] to add him to Dr. Lindsay Rho waitlist to schedule colonoscopy.  Thanks, [place], [place]

## 2023-09-30 NOTE — Telephone Encounter (Signed)
 okay

## 2023-10-21 NOTE — Telephone Encounter (Signed)
 Damien at Dr. Jurline office stated that she will send referral to [facility] for Samuel Mason.  Called Samuel Mason back to let him know.  Thanks, [place], CMA

## 2023-10-21 NOTE — Telephone Encounter (Signed)
 The patient called in and requested to speak with Moira Andrews.

## 2023-11-25 ENCOUNTER — Encounter
Admission: RE | Disposition: A | Discharge status: Home / Self Care | Payer: Self-pay | Source: Home / Self Care | Attending: Gastroenterology

## 2023-11-25 ENCOUNTER — Ambulatory Visit: Admitting: Certified Registered"

## 2023-11-25 ENCOUNTER — Ambulatory Visit
Admission: RE | Admit: 2023-11-25 | Discharge: 2023-11-25 | Disposition: A | Discharge status: Home / Self Care | Attending: Gastroenterology | Admitting: Gastroenterology

## 2023-11-25 ENCOUNTER — Encounter: Payer: Self-pay | Admitting: Gastroenterology

## 2023-11-25 DIAGNOSIS — I129 Hypertensive chronic kidney disease with stage 1 through stage 4 chronic kidney disease, or unspecified chronic kidney disease: Secondary | ICD-10-CM | POA: Insufficient documentation

## 2023-11-25 DIAGNOSIS — Z1211 Encounter for screening for malignant neoplasm of colon: Secondary | ICD-10-CM | POA: Diagnosis present

## 2023-11-25 DIAGNOSIS — G4733 Obstructive sleep apnea (adult) (pediatric): Secondary | ICD-10-CM | POA: Diagnosis not present

## 2023-11-25 DIAGNOSIS — E785 Hyperlipidemia, unspecified: Secondary | ICD-10-CM | POA: Diagnosis not present

## 2023-11-25 DIAGNOSIS — D123 Benign neoplasm of transverse colon: Secondary | ICD-10-CM | POA: Insufficient documentation

## 2023-11-25 DIAGNOSIS — D122 Benign neoplasm of ascending colon: Secondary | ICD-10-CM | POA: Insufficient documentation

## 2023-11-25 DIAGNOSIS — I2510 Atherosclerotic heart disease of native coronary artery without angina pectoris: Secondary | ICD-10-CM | POA: Insufficient documentation

## 2023-11-25 DIAGNOSIS — N183 Chronic kidney disease, stage 3 unspecified: Secondary | ICD-10-CM | POA: Insufficient documentation

## 2023-11-25 DIAGNOSIS — K635 Polyp of colon: Secondary | ICD-10-CM | POA: Insufficient documentation

## 2023-11-25 DIAGNOSIS — K219 Gastro-esophageal reflux disease without esophagitis: Secondary | ICD-10-CM | POA: Diagnosis not present

## 2023-11-25 HISTORY — PX: POLYPECTOMY: SHX149

## 2023-11-25 HISTORY — PX: COLONOSCOPY: SHX5424

## 2023-11-25 SURGERY — COLONOSCOPY
Anesthesia: General

## 2023-11-25 MED ORDER — LIDOCAINE HCL (PF) 2 % IJ SOLN
INTRAMUSCULAR | Status: DC | PRN
  Administered 2023-11-25: 40 mg via INTRADERMAL

## 2023-11-25 MED ORDER — SODIUM CHLORIDE 0.9 % IV SOLN: INTRAVENOUS | Status: DC

## 2023-11-25 MED ORDER — PROPOFOL 10 MG/ML IV BOLUS
INTRAVENOUS | Status: DC | PRN
  Administered 2023-11-25 (×2): 20 mg via INTRAVENOUS
  Administered 2023-11-25: 30 mg via INTRAVENOUS
  Administered 2023-11-25: 50 mg via INTRAVENOUS
  Administered 2023-11-25: 20 mg via INTRAVENOUS
  Administered 2023-11-25: 40 mg via INTRAVENOUS

## 2023-11-25 NOTE — H&P (Signed)
 Samuel Mason , MD [address], [place], [place], [address] Phone: [phone] Fax: [phone]  Primary Care Physician:  Weyman Bright, MD   Pre-Procedure History & Physical: HPI:  CORNELIS Mason is a 73 y.o. male is here for an colonoscopy.   Past Medical History:  Diagnosis Date   Asymptomatic PVCs    a. 07/2012 Holter: 7965 PVCs in 48 hrs; b. 07/2015 24hr Holter: 6000 PVCs (7%).   CAD (coronary artery disease)    a. 2009 Cath: nonobs dzs; b. 02/2010 Cath: LAD 1m, D1 90, LCX 77m, RCA min irregs, EF 60%-->Med Rx; c. 07/2012 Neg ETT; d. 05/2015 Neg MV. EF 73%.   Carotid arterial disease (HCC)    a. 11/2016 Carotid u/s: mild bilat carotid plaques.   CKD (chronic kidney disease), stage III (HCC)    a. Creat 1.67 05/22/2017.   GERD (gastroesophageal reflux disease)    a. noncompliant w/ PPI.   History of echocardiogram    a. 09/2016 Echo: EF 60-65%, no orwma, mild MR, mildly dil LA/RA.   Hyperlipidemia    Hypertension    Morbid obesity (HCC)    Obstructive sleep apnea    a. noncompliant w/ CPAP.   TIA (transient ischemic attack)    Tumor of parotid gland     Past Surgical History:  Procedure Laterality Date   CARDIAC CATHETERIZATION  2011   50% mid LAD stenosis, 40% proximal LCX, EF 60%   CARDIAC CATHETERIZATION  2009   Mild CAD             COLONOSCOPY WITH PROPOFOL  N/A 10/19/2018   Procedure: COLONOSCOPY WITH PROPOFOL ;  Surgeon: Mason Ruel, MD;  Location: [facility];  Service: Gastroenterology;  Laterality: N/A;   LEFT HEART CATH AND CORONARY ANGIOGRAPHY N/A 11/27/2021   Procedure: LEFT HEART CATH AND CORONARY ANGIOGRAPHY with poss PCI;  Surgeon: Darron Deatrice LABOR, MD;  Location: [facility] INVASIVE CV LAB;  Service: Cardiovascular;  Laterality: N/A;    Prior to Admission medications   Medication Sig Start Date End Date Taking? Authorizing Provider  aspirin  81 MG tablet Take 81 mg by mouth daily.    [provider]  carvedilol  (COREG ) 6.25 MG tablet  Take 1 tablet (6.25 mg total) by mouth 2 (two) times daily. 09/24/23   Darron Deatrice LABOR, MD  CRESTOR 10 MG tablet Take 10 mg by mouth daily. 06/27/15   [provider]  eplerenone  (INSPRA ) 25 MG tablet Take 1 tablet (25 mg total) by mouth daily. 09/24/23   Darron Deatrice LABOR, MD  famotidine  (PEPCID ) 20 MG tablet TAKE 1 TABLET (20 MG TOTAL) BY MOUTH 2 (TWO) TIMES DAILY AS NEEDED FOR HEARTBURN OR INDIGESTION. 10/15/22   Hunsucker, Donnice SAUNDERS, MD  FARXIGA 10 MG TABS tablet Take 10 mg by mouth daily. 11/08/22   [provider]  HYZAAR  100-25 MG tablet Take 1 tablet by mouth daily. 09/24/23   Darron Deatrice LABOR, MD  NORVASC  10 MG tablet Take 1 tablet (10 mg total) by mouth daily. 09/24/23   Darron Deatrice LABOR, MD    Allergies as of 11/12/2023 - Review Complete 09/29/2023  Allergen Reaction Noted   Simvastatin      Family History  Problem Relation Age of Onset   Heart attack Father        MI    Social History   Socioeconomic History   Marital status: Married    Spouse name: Grayce   Number of children: 2   Years of  education: Not on file   Highest education level: Not on file  Occupational History   Not on file  Tobacco Use   Smoking status: Never   Smokeless tobacco: Never  Vaping Use   Vaping status: Never Used  Substance and Sexual Activity   Alcohol use: No    Comment: social  drinker   Drug use: No   Sexual activity: Not on file  Other Topics Concern   Not on file  Social History Narrative   Lives at home with wife; kids out the house: 1 in [place], 1 in [place].    Social Drivers of Corporate investment banker Strain: Low Risk  (02/12/2023)   Received from [facility]   Overall Financial Resource Strain (CARDIA)    Difficulty of Paying Living Expenses: Not hard at all  Food Insecurity: No Food Insecurity (02/12/2023)   Received from [facility]   Hunger Vital Sign    Within the past 12 months, you worried that your food  would run out before you got the money to buy more.: Never true    Within the past 12 months, the food you bought just didn't last and you didn't have money to get more.: Never true  Transportation Needs: No Transportation Needs (02/12/2023)   Received from [facility] - Transportation    In the past 12 months, has lack of transportation kept you from medical appointments or from getting medications?: No    Lack of Transportation (Non-Medical): No  Physical Activity: Not on file  Stress: Not on file  Social Connections: Not on file  Intimate Partner Violence: Not on file    Review of Systems: See HPI, otherwise negative ROS  Physical Exam: There were no vitals taken for this visit. General:   Alert,  pleasant and cooperative in NAD Head:  Normocephalic and atraumatic. Neck:  Supple; no masses or thyromegaly. Lungs:  Clear throughout to auscultation, normal respiratory effort.    Heart:  +S1, +S2, Regular rate and rhythm, No edema. Abdomen:  Soft, nontender and nondistended. Normal bowel sounds, without guarding, and without rebound.   Neurologic:  Alert and  oriented x4;  grossly normal neurologically.  Impression/Plan: Samuel Mason is here for an colonoscopy to be performed for surveillance due to prior history of colon polyps   Risks, benefits, limitations, and alternatives regarding  colonoscopy have been reviewed with the patient.  Questions have been answered.  All parties agreeable.   Samuel Kung, MD  11/25/2023, 9:48 AM

## 2023-11-25 NOTE — Transfer of Care (Signed)
 Immediate Anesthesia Transfer of Care Note  Patient: Samuel Mason  Procedure(s) Performed: COLONOSCOPY POLYPECTOMY, INTESTINE  Patient Location: PACU  Anesthesia Type:MAC  Level of Consciousness: drowsy  Airway & Oxygen Therapy: Patient Spontanous Breathing and Patient connected to nasal cannula oxygen  Post-op Assessment: Report given to RN and Post -op Vital signs reviewed and stable  Post vital signs: Reviewed and stable  Last Vitals:  Vitals Value Taken Time  BP 126/69 11/25/23 10:56  Temp    Pulse 48 11/25/23 10:57  Resp 13 11/25/23 10:57  SpO2 100 % 11/25/23 10:57  Vitals shown include unfiled device data.  Last Pain:  Vitals:   11/25/23 1056  TempSrc:   PainSc: Asleep         Complications: No notable events documented.

## 2023-11-25 NOTE — Anesthesia Preprocedure Evaluation (Signed)
 Anesthesia Evaluation  Patient identified by MRN, date of birth, ID band Patient awake    Reviewed: Allergy & Precautions, H&P , NPO status , Patient's Chart, lab work & pertinent test results, reviewed documented beta blocker date and time   Airway Mallampati: II   Neck ROM: full    Dental  (+) Poor Dentition   Pulmonary sleep apnea    Pulmonary exam normal        Cardiovascular Exercise Tolerance: Poor hypertension, On Medications + CAD  Normal cardiovascular exam Rhythm:regular Rate:Normal     Neuro/Psych  PSYCHIATRIC DISORDERS Anxiety     TIACVA    GI/Hepatic Neg liver ROS,GERD  ,,  Endo/Other  negative endocrine ROS    Renal/GU      Musculoskeletal   Abdominal   Peds  Hematology negative hematology ROS (+)   Anesthesia Other Findings Past Medical History: No date: Asymptomatic PVCs     Comment:  a. 07/2012 Holter: 7965 PVCs in 48 hrs; b. 07/2015 24hr               Holter: 6000 PVCs (7%). No date: CAD (coronary artery disease)     Comment:  a. 2009 Cath: nonobs dzs; b. 02/2010 Cath: LAD 58m, D1               90, LCX 33m, RCA min irregs, EF 60%-->Med Rx; c. 07/2012               Neg ETT; d. 05/2015 Neg MV. EF 73%. No date: Carotid arterial disease (HCC)     Comment:  a. 11/2016 Carotid u/s: mild bilat carotid plaques. No date: CKD (chronic kidney disease), stage III (HCC)     Comment:  a. Creat 1.67 05/22/2017. No date: GERD (gastroesophageal reflux disease)     Comment:  a. noncompliant w/ PPI. No date: History of echocardiogram     Comment:  a. 09/2016 Echo: EF 60-65%, no orwma, mild MR, mildly dil              LA/RA. No date: Hyperlipidemia No date: Hypertension No date: Morbid obesity (HCC) No date: Obstructive sleep apnea     Comment:  a. noncompliant w/ CPAP. No date: TIA (transient ischemic attack) Past Surgical History: 2011: CARDIAC CATHETERIZATION     Comment:  50% mid LAD stenosis, 40% proximal  LCX, EF 60% 2009: CARDIAC CATHETERIZATION     Comment:  Mild CAD             Reproductive/Obstetrics negative OB ROS                              Anesthesia Physical Anesthesia Plan  ASA: 3  Anesthesia Plan: General   Post-op Pain Management: Minimal or no pain anticipated   Induction: Intravenous  PONV Risk Score and Plan: 2 and Propofol  infusion and TIVA  Airway Management Planned: Nasal Cannula  Additional Equipment: None  Intra-op Plan:   Post-operative Plan:   Informed Consent: I have reviewed the patients History and Physical, chart, labs and discussed the procedure including the risks, benefits and alternatives for the proposed anesthesia with the patient or authorized representative who has indicated his/her understanding and acceptance.     Dental advisory given  Plan Discussed with: CRNA and Surgeon  Anesthesia Plan Comments: (Discussed risks of anesthesia with patient, including possibility of difficulty with spontaneous ventilation under anesthesia necessitating airway intervention, PONV, and rare risks such as cardiac or  respiratory or neurological events, and allergic reactions. Discussed the role of CRNA in patient's perioperative care. Patient understands.)        Anesthesia Quick Evaluation

## 2023-11-25 NOTE — Op Note (Signed)
 [facility] Patient Name: Samuel Mason Procedure Date: 11/25/2023 10:22 AM MRN: [id number] Account #: [id number] Date of Birth: Oct 06, 1950 Admit Type: Outpatient Age: 73 Room: [facility] ENDO ROOM 3 Gender: Male Note Status: Finalized Instrument Name: [id number] Procedure:             Colonoscopy Indications:           Surveillance: Personal history of adenomatous polyps                         on last colonoscopy 5 years ago, Last colonoscopy:                         June 2020 Providers:             [name] MD, MD Medicines:             Monitored Anesthesia Care Complications:         No immediate complications. Procedure:             Pre-Anesthesia Assessment:                        - Prior to the procedure, a History and Physical was                         performed, and patient medications, allergies and                         sensitivities were reviewed. The patient's tolerance                         of previous anesthesia was reviewed.                        - The risks and benefits of the procedure and the                         sedation options and risks were discussed with the                         patient. All questions were answered and informed                         consent was obtained.                        - ASA Grade Assessment: II - A patient with mild                         systemic disease.                        After obtaining informed consent, the colonoscope was                         passed under direct vision. Throughout the procedure,                         the patient's blood pressure, pulse, and oxygen  saturations were monitored continuously. The                         Colonoscope was introduced through the anus and                         advanced to the the terminal ileum. The colonoscopy                         was performed with ease. The patient tolerated the                          procedure well. The quality of the bowel preparation                         was excellent. The ileocecal valve, appendiceal                         orifice, and rectum were photographed. Findings:      The perianal and digital rectal examinations were normal.      Three sessile polyps were found in the transverse colon, ascending colon       and cecum. The polyps were 3 to 4 mm in size. These polyps were removed       with a jumbo cold forceps. Resection and retrieval were complete.      The exam was otherwise without abnormality on direct and retroflexion       views. Impression:            - Three 3 to 4 mm polyps in the transverse colon, in                         the ascending colon and in the cecum, removed with a                         jumbo cold forceps. Resected and retrieved.                        - The examination was otherwise normal on direct and                         retroflexion views. Recommendation:        - Discharge patient to home (with escort).                        - Resume previous diet.                        - Continue present medications.                        - Await pathology results.                        - Repeat colonoscopy for surveillance based on                         pathology results. Procedure Code(s):     --- Professional ---  54619, Colonoscopy, flexible; with biopsy, single or                         multiple Diagnosis Code(s):     --- Professional ---                        D12.3, Benign neoplasm of transverse colon (hepatic                         flexure or splenic flexure)                        Z86.010, Personal history of colonic polyps                        D12.2, Benign neoplasm of ascending colon                        D12.0, Benign neoplasm of cecum CPT copyright 2022 American Medical Association. All rights reserved. The codes documented in this report are preliminary and upon coder review may   be revised to meet current compliance requirements. [name], MD [name] MD, MD 11/25/2023 10:56:24 AM This report has been signed electronically. Number of Addenda: 0 Note Initiated On: 11/25/2023 10:22 AM Scope Withdrawal Time: 0 hours 11 minutes 7 seconds  Total Procedure Duration: 0 hours 13 minutes 14 seconds  Estimated Blood Loss:  Estimated blood loss: none.      [facility]

## 2023-11-26 LAB — SURGICAL PATHOLOGY

## 2023-11-26 NOTE — Anesthesia Postprocedure Evaluation (Signed)
 Anesthesia Post Note  Patient: Samuel Mason  Procedure(s) Performed: COLONOSCOPY POLYPECTOMY, INTESTINE  Patient location during evaluation: Endoscopy Anesthesia Type: General Level of consciousness: awake and alert Pain management: pain level controlled Vital Signs Assessment: post-procedure vital signs reviewed and stable Respiratory status: spontaneous breathing, nonlabored ventilation, respiratory function stable and patient connected to nasal cannula oxygen Cardiovascular status: blood pressure returned to baseline and stable Postop Assessment: no apparent nausea or vomiting Anesthetic complications: no   No notable events documented.   Last Vitals:  Vitals:   11/25/23 1106 11/25/23 1116  BP: 114/73 115/82  Pulse: 60 (!) 58  Resp: 16 17  Temp:    SpO2: 99% 98%    Last Pain:  Vitals:   11/25/23 1116  TempSrc:   PainSc: 0-No pain                 Debby Mines

## 2023-12-24 ENCOUNTER — Ambulatory Visit (INDEPENDENT_AMBULATORY_CARE_PROVIDER_SITE_OTHER): Admitting: Internal Medicine

## 2023-12-24 ENCOUNTER — Encounter: Payer: Self-pay | Admitting: Internal Medicine

## 2023-12-24 VITALS — BP 142/100 | HR 87 | Ht 75.0 in | Wt 275.0 lb

## 2023-12-24 DIAGNOSIS — R7303 Prediabetes: Secondary | ICD-10-CM | POA: Insufficient documentation

## 2023-12-24 DIAGNOSIS — E782 Mixed hyperlipidemia: Secondary | ICD-10-CM | POA: Diagnosis not present

## 2023-12-24 DIAGNOSIS — I493 Ventricular premature depolarization: Secondary | ICD-10-CM

## 2023-12-24 DIAGNOSIS — K219 Gastro-esophageal reflux disease without esophagitis: Secondary | ICD-10-CM

## 2023-12-24 DIAGNOSIS — I10 Essential (primary) hypertension: Secondary | ICD-10-CM

## 2023-12-24 MED ORDER — CARVEDILOL 6.25 MG PO TABS: 6.2500 mg | ORAL_TABLET | Freq: Two times a day (BID) | ORAL | 1 refills | Status: AC

## 2023-12-24 MED ORDER — NORVASC 10 MG PO TABS: 10.0000 mg | ORAL_TABLET | Freq: Every day | ORAL | 1 refills | Status: DC

## 2023-12-24 NOTE — Progress Notes (Signed)
 New Patient Office Visit  Subjective   Patient ID: Samuel Mason, male    DOB: 27-Mar-1951  Age: 73 y.o. MRN: [id number]  CC:  Chief Complaint  Patient presents with   Establish Care    Establish care. Jadali patient.     HPI Samuel Mason presents to establish care Previous Primary Care provider/office:   he does not have additional concerns to discuss today.   Patient is here to establish care with us . He reports he is doing well. He has no complaints today. Patient reports he takes most of his medications as prescribed. He does endorse taking his statin every other day due to body fatigue. Encouraged patient to start taking it daily and switch his dose to at bedtime and we will collect his routine labs in one month. He also reports minimal compliance with his sleep apnea oral device; he did not have success with using a CPAP in the past. His blood pressure is elevated today; he reports he has not taken any of his medications yet today and is fasting. He endorses getting yearly eye exams.  He denies any headache, chest pain, shortness of breath, nausea, abdominal pain, vomiting, diarrhea/ constipation.     Outpatient Encounter Medications as of 12/24/2023  Medication Sig   aspirin  81 MG tablet Take 81 mg by mouth daily.   CRESTOR 10 MG tablet Take 10 mg by mouth daily.   eplerenone  (INSPRA ) 25 MG tablet Take 1 tablet (25 mg total) by mouth daily.   famotidine  (PEPCID ) 20 MG tablet TAKE 1 TABLET (20 MG TOTAL) BY MOUTH 2 (TWO) TIMES DAILY AS NEEDED FOR HEARTBURN OR INDIGESTION.   FARXIGA 10 MG TABS tablet Take 10 mg by mouth as needed.   HYZAAR  100-25 MG tablet Take 1 tablet by mouth daily.   [DISCONTINUED] carvedilol  (COREG ) 6.25 MG tablet Take 1 tablet (6.25 mg total) by mouth 2 (two) times daily.   [DISCONTINUED] NORVASC  10 MG tablet Take 1 tablet (10 mg total) by mouth daily.   carvedilol  (COREG ) 6.25 MG tablet Take 1 tablet (6.25 mg total) by mouth 2 (two) times daily.    NORVASC  10 MG tablet Take 1 tablet (10 mg total) by mouth daily.   No facility-administered encounter medications on file as of 12/24/2023.    Past Medical History:  Diagnosis Date   Asymptomatic PVCs    a. 07/2012 Holter: 7965 PVCs in 48 hrs; b. 07/2015 24hr Holter: 6000 PVCs (7%).   CAD (coronary artery disease)    a. 2009 Cath: nonobs dzs; b. 02/2010 Cath: LAD 30m, D1 90, LCX 46m, RCA min irregs, EF 60%-->Med Rx; c. 07/2012 Neg ETT; d. 05/2015 Neg MV. EF 73%.   Carotid arterial disease (HCC)    a. 11/2016 Carotid u/s: mild bilat carotid plaques.   CKD (chronic kidney disease), stage III (HCC)    a. Creat 1.67 05/22/2017.   GERD (gastroesophageal reflux disease)    a. noncompliant w/ PPI.   History of echocardiogram    a. 09/2016 Echo: EF 60-65%, no orwma, mild MR, mildly dil LA/RA.   Hyperlipidemia    Hypertension    Morbid obesity (HCC)    Obstructive sleep apnea    a. noncompliant w/ CPAP.   TIA (transient ischemic attack)    Tumor of parotid gland     Past Surgical History:  Procedure Laterality Date   CARDIAC CATHETERIZATION  2011   50% mid LAD stenosis, 40% proximal LCX, EF 60%   CARDIAC CATHETERIZATION  2009   Mild CAD             COLONOSCOPY N/A 11/25/2023   Procedure: COLONOSCOPY;  Surgeon: Therisa Bi, MD;  Location: [facility] ENDOSCOPY;  Service: Gastroenterology;  Laterality: N/A;   COLONOSCOPY WITH PROPOFOL  N/A 10/19/2018   Procedure: COLONOSCOPY WITH PROPOFOL ;  Surgeon: Therisa Bi, MD;  Location: [facility] ENDOSCOPY;  Service: Gastroenterology;  Laterality: N/A;   LEFT HEART CATH AND CORONARY ANGIOGRAPHY N/A 11/27/2021   Procedure: LEFT HEART CATH AND CORONARY ANGIOGRAPHY with poss PCI;  Surgeon: Darron Deatrice LABOR, MD;  Location: [facility] INVASIVE CV LAB;  Service: Cardiovascular;  Laterality: N/A;   POLYPECTOMY  11/25/2023   Procedure: POLYPECTOMY, INTESTINE;  Surgeon: Therisa Bi, MD;  Location: [facility] ENDOSCOPY;  Service: Gastroenterology;;    Family History  Problem Relation Age of  Onset   Heart attack Father        MI    Social History   Socioeconomic History   Marital status: Married    Spouse name: Samuel Mason   Number of children: 2   Years of education: Not on file   Highest education level: Not on file  Occupational History   Not on file  Tobacco Use   Smoking status: Never   Smokeless tobacco: Never  Vaping Use   Vaping status: Never Used  Substance and Sexual Activity   Alcohol use: No    Comment: social  drinker   Drug use: No   Sexual activity: Not on file  Other Topics Concern   Not on file  Social History Narrative   Lives at home with wife; kids out the house: 1 in [place], 1 in [place].    Social Drivers of Corporate investment banker Strain: Low Risk  (02/12/2023)   Received from [facility]   Overall Financial Resource Strain (CARDIA)    Difficulty of Paying Living Expenses: Not hard at all  Food Insecurity: No Food Insecurity (02/12/2023)   Received from [facility]   Hunger Vital Sign    Within the past 12 months, you worried that your food would run out before you got the money to buy more.: Never true    Within the past 12 months, the food you bought just didn't last and you didn't have money to get more.: Never true  Transportation Needs: No Transportation Needs (02/12/2023)   Received from [facility] Transportation    In the past 12 months, has lack of transportation kept you from medical appointments or from getting medications?: No    Lack of Transportation (Non-Medical): No  Physical Activity: Not on file  Stress: Not on file  Social Connections: Not on file  Intimate Partner Violence: Not on file    Review of Systems  Constitutional:  Positive for malaise/fatigue.  HENT: Negative.    Eyes: Negative.   Respiratory: Negative.  Negative for cough and shortness of breath.   Cardiovascular: Negative.  Negative for chest pain, palpitations and leg  swelling.  Gastrointestinal:  Positive for heartburn (intermittently, takes famotidine  as needed). Negative for abdominal pain, constipation, diarrhea, nausea and vomiting.  Genitourinary: Negative.  Negative for dysuria and flank pain.  Musculoskeletal: Negative.  Negative for joint pain and myalgias.  Skin: Negative.   Neurological: Negative.  Negative for dizziness and headaches.  Endo/Heme/Allergies: Negative.   Psychiatric/Behavioral: Negative.  Negative for depression and suicidal ideas. The patient is not nervous/anxious.         Objective  BP (!) 142/100   Pulse 87   Ht 6' 3 (1.905 m)   Wt 275 lb (124.7 kg)   SpO2 98%   BMI 34.37 kg/m   Physical Exam Vitals and nursing note reviewed.  Constitutional:      Appearance: Normal appearance.  HENT:     Head: Normocephalic and atraumatic.     Nose: Nose normal.     Mouth/Throat:     Mouth: Mucous membranes are moist.     Pharynx: Oropharynx is clear.  Eyes:     Conjunctiva/sclera: Conjunctivae normal.     Pupils: Pupils are equal, round, and reactive to light.  Cardiovascular:     Rate and Rhythm: Normal rate. Rhythm irregular.     Pulses: Normal pulses.     Heart sounds: Normal heart sounds.  Pulmonary:     Effort: Pulmonary effort is normal.     Breath sounds: Normal breath sounds.  Abdominal:     General: Bowel sounds are normal.     Palpations: Abdomen is soft.  Musculoskeletal:        General: Normal range of motion.     Cervical back: Normal range of motion.  Skin:    General: Skin is warm and dry.  Neurological:     General: No focal deficit present.     Mental Status: He is alert and oriented to person, place, and time.  Psychiatric:        Mood and Affect: Mood normal.        Behavior: Behavior normal.        Judgment: Judgment normal.        Assessment & Plan:  Continue taking medications as prescribed. Refilled prescriptions. Start taking Crestor daily; check routine labs in 1 month.  Continue monitoring blood pressure at home. Continue specialist appointments as recommended. Problem List Items Addressed This Visit     Hyperlipidemia   Relevant Medications   carvedilol  (COREG ) 6.25 MG tablet   NORVASC  10 MG tablet   Other Relevant Orders   Lipid Panel w/o Chol/HDL Ratio   Essential hypertension - Primary   Relevant Medications   carvedilol  (COREG ) 6.25 MG tablet   NORVASC  10 MG tablet   Other Relevant Orders   CMP14+EGFR   CBC with Diff   Asymptomatic PVCs   Relevant Medications   carvedilol  (COREG ) 6.25 MG tablet   NORVASC  10 MG tablet   GERD (gastroesophageal reflux disease)   Prediabetes   Relevant Orders   Hemoglobin A1c    Return in about 3 months (around 03/24/2024).   Total time spent: 30 minutes  Samuel FREDY RAMAN, MD  12/24/2023   This document may have been prepared by [facility] Voice Recognition software and as such may include unintentional dictation errors.

## 2023-12-29 ENCOUNTER — Encounter: Payer: Self-pay | Admitting: Internal Medicine

## 2023-12-29 DIAGNOSIS — I10 Essential (primary) hypertension: Secondary | ICD-10-CM

## 2023-12-31 ENCOUNTER — Other Ambulatory Visit: Payer: Self-pay | Admitting: Internal Medicine

## 2023-12-31 MED ORDER — HYZAAR 100-25 MG PO TABS: 1.0000 | ORAL_TABLET | Freq: Every day | ORAL | 1 refills | Status: DC

## 2023-12-31 MED ORDER — CRESTOR 10 MG PO TABS: 10.0000 mg | ORAL_TABLET | Freq: Every day | ORAL | 1 refills | Status: AC

## 2023-12-31 MED ORDER — NORVASC 10 MG PO TABS
15.0000 mg | ORAL_TABLET | Freq: Every day | ORAL | 1 refills | Status: AC
Start: 2023-12-31 — End: ?

## 2024-01-04 ENCOUNTER — Other Ambulatory Visit

## 2024-01-04 DIAGNOSIS — E782 Mixed hyperlipidemia: Secondary | ICD-10-CM

## 2024-01-04 DIAGNOSIS — R7303 Prediabetes: Secondary | ICD-10-CM

## 2024-01-04 DIAGNOSIS — I10 Essential (primary) hypertension: Secondary | ICD-10-CM

## 2024-01-05 ENCOUNTER — Ambulatory Visit: Payer: Self-pay | Admitting: Internal Medicine

## 2024-01-05 LAB — CBC WITH DIFFERENTIAL/PLATELET
Basophils Absolute: 0 x10E3/uL (ref 0.0–0.2)
Basos: 1 %
EOS (ABSOLUTE): 0.1 x10E3/uL (ref 0.0–0.4)
Eos: 2 %
Hematocrit: 42.2 % (ref 37.5–51.0)
Hemoglobin: 14 g/dL (ref 13.0–17.7)
Immature Grans (Abs): 0 x10E3/uL (ref 0.0–0.1)
Immature Granulocytes: 0 %
Lymphocytes Absolute: 2.3 x10E3/uL (ref 0.7–3.1)
Lymphs: 42 %
MCH: 30.4 pg (ref 26.6–33.0)
MCHC: 33.2 g/dL (ref 31.5–35.7)
MCV: 92 fL (ref 79–97)
Monocytes Absolute: 0.5 x10E3/uL (ref 0.1–0.9)
Monocytes: 9 %
Neutrophils Absolute: 2.5 x10E3/uL (ref 1.4–7.0)
Neutrophils: 46 %
Platelets: 250 x10E3/uL (ref 150–450)
RBC: 4.61 x10E6/uL (ref 4.14–5.80)
RDW: 13 % (ref 11.6–15.4)
WBC: 5.4 x10E3/uL (ref 3.4–10.8)

## 2024-01-05 LAB — LIPID PANEL W/O CHOL/HDL RATIO
Cholesterol, Total: 176 mg/dL (ref 100–199)
HDL: 41 mg/dL (ref >39)
LDL Chol Calc (NIH): 119 mg/dL — ABNORMAL HIGH (ref 0–99)
Triglycerides: 85 mg/dL (ref 0–149)
VLDL Cholesterol Cal: 16 mg/dL (ref 5–40)

## 2024-01-05 LAB — CMP14+EGFR
ALT: 15 IU/L (ref 0–44)
AST: 18 IU/L (ref 0–40)
Albumin: 3.8 g/dL (ref 3.8–4.8)
Alkaline Phosphatase: 70 IU/L (ref 49–135)
BUN/Creatinine Ratio: 10 (ref 10–24)
BUN: 18 mg/dL (ref 8–27)
Bilirubin Total: 0.5 mg/dL (ref 0.0–1.2)
CO2: 22 mmol/L (ref 20–29)
Calcium: 9.2 mg/dL (ref 8.6–10.2)
Chloride: 104 mmol/L (ref 96–106)
Creatinine, Ser: 1.84 mg/dL — ABNORMAL HIGH (ref 0.76–1.27)
Globulin, Total: 3.2 g/dL (ref 1.5–4.5)
Glucose: 91 mg/dL (ref 70–99)
Potassium: 4.2 mmol/L (ref 3.5–5.2)
Sodium: 141 mmol/L (ref 134–144)
Total Protein: 7 g/dL (ref 6.0–8.5)
eGFR: 38 mL/min/1.73 — ABNORMAL LOW (ref >59)

## 2024-01-05 LAB — HEMOGLOBIN A1C
Est. average glucose Bld gHb Est-mCnc: 137 mg/dL
Hgb A1c MFr Bld: 6.4 % — ABNORMAL HIGH (ref 4.8–5.6)

## 2024-01-25 ENCOUNTER — Ambulatory Visit (INDEPENDENT_AMBULATORY_CARE_PROVIDER_SITE_OTHER): Admitting: Internal Medicine

## 2024-01-25 VITALS — BP 130/78 | HR 54 | Ht 75.0 in | Wt 275.0 lb

## 2024-01-25 DIAGNOSIS — R7303 Prediabetes: Secondary | ICD-10-CM | POA: Diagnosis not present

## 2024-01-25 DIAGNOSIS — I10 Essential (primary) hypertension: Secondary | ICD-10-CM | POA: Diagnosis not present

## 2024-01-25 DIAGNOSIS — E782 Mixed hyperlipidemia: Secondary | ICD-10-CM | POA: Diagnosis not present

## 2024-01-25 DIAGNOSIS — R001 Bradycardia, unspecified: Secondary | ICD-10-CM | POA: Insufficient documentation

## 2024-01-25 NOTE — Progress Notes (Signed)
 Established Patient Office Visit  Subjective:  Patient ID: Samuel Mason, male    DOB: 1950/06/21  Age: 73 y.o. MRN: [id number]  Chief Complaint  Patient presents with   Follow-up    Lab follow up/discuss low heart rate    No new complaints, here for lab review and medication refills. Stopped Farxiga as he was wary of the side effects. Home bg readings have been normal especially fasting. Also c/o recent bradycardia but denies any lightheadedness or dizziness.    No other concerns at this time.   Past Medical History:  Diagnosis Date   Asymptomatic PVCs    a. 07/2012 Holter: 7965 PVCs in 48 hrs; b. 07/2015 24hr Holter: 6000 PVCs (7%).   CAD (coronary artery disease)    a. 2009 Cath: nonobs dzs; b. 02/2010 Cath: LAD 16m, D1 90, LCX 90m, RCA min irregs, EF 60%-->Med Rx; c. 07/2012 Neg ETT; d. 05/2015 Neg MV. EF 73%.   Carotid arterial disease    a. 11/2016 Carotid u/Manly Nestle: mild bilat carotid plaques.   CKD (chronic kidney disease), stage III (HCC)    a. Creat 1.67 05/22/2017.   GERD (gastroesophageal reflux disease)    a. noncompliant w/ PPI.   History of echocardiogram    a. 09/2016 Echo: EF 60-65%, no orwma, mild MR, mildly dil LA/RA.   Hyperlipidemia    Hypertension    Morbid obesity (HCC)    Obstructive sleep apnea    a. noncompliant w/ CPAP.   TIA (transient ischemic attack)    Tumor of parotid gland     Past Surgical History:  Procedure Laterality Date   CARDIAC CATHETERIZATION  2011   50% mid LAD stenosis, 40% proximal LCX, EF 60%   CARDIAC CATHETERIZATION  2009   Mild CAD             COLONOSCOPY N/A 11/25/2023   Procedure: COLONOSCOPY;  Surgeon: Therisa Bi, MD;  Location: [facility] ENDOSCOPY;  Service: Gastroenterology;  Laterality: N/A;   COLONOSCOPY WITH PROPOFOL  N/A 10/19/2018   Procedure: COLONOSCOPY WITH PROPOFOL ;  Surgeon: Therisa Bi, MD;  Location: [facility] ENDOSCOPY;  Service: Gastroenterology;  Laterality: N/A;   LEFT HEART CATH AND CORONARY ANGIOGRAPHY N/A 11/27/2021    Procedure: LEFT HEART CATH AND CORONARY ANGIOGRAPHY with poss PCI;  Surgeon: Darron Deatrice LABOR, MD;  Location: [facility] INVASIVE CV LAB;  Service: Cardiovascular;  Laterality: N/A;   POLYPECTOMY  11/25/2023   Procedure: POLYPECTOMY, INTESTINE;  Surgeon: Therisa Bi, MD;  Location: [facility] ENDOSCOPY;  Service: Gastroenterology;;    Social History   Socioeconomic History   Marital status: Married    Spouse name: Grayce   Number of children: 2   Years of education: Not on file   Highest education level: Not on file  Occupational History   Not on file  Tobacco Use   Smoking status: Never   Smokeless tobacco: Never  Vaping Use   Vaping status: Never Used  Substance and Sexual Activity   Alcohol use: No    Comment: social  drinker   Drug use: No   Sexual activity: Not on file  Other Topics Concern   Not on file  Social History Narrative   Lives at home with wife; kids out the house: 1 in [place], 1 in [place].    Social Drivers of Corporate investment banker Strain: Low Risk  (02/12/2023)   Received from [facility]   Overall Financial Resource Strain (CARDIA)    Difficulty of Paying Living Expenses:  Not hard at all  Food Insecurity: No Food Insecurity (02/12/2023)   Received from [facility]   Hunger Vital Sign    Within the past 12 months, you worried that your food would run out before you got the money to buy more.: Never true    Within the past 12 months, the food you bought just didn't last and you didn't have money to get more.: Never true  Transportation Needs: No Transportation Needs (02/12/2023)   Received from [facility] - Transportation    In the past 12 months, has lack of transportation kept you from medical appointments or from getting medications?: No    Lack of Transportation (Non-Medical): No  Physical Activity: Not on file  Stress: Not on file  Social Connections: Not on file  Intimate Partner  Violence: Not on file    Family History  Problem Relation Age of Onset   Heart attack Father        MI    Allergies  Allergen Reactions   Simvastatin     REACTION: Nausea    Outpatient Medications Prior to Visit  Medication Sig   aspirin  81 MG tablet Take 81 mg by mouth daily.   carvedilol  (COREG ) 6.25 MG tablet Take 1 tablet (6.25 mg total) by mouth 2 (two) times daily.   CRESTOR  10 MG tablet Take 1 tablet (10 mg total) by mouth daily.   eplerenone  (INSPRA ) 25 MG tablet Take 1 tablet (25 mg total) by mouth daily.   famotidine  (PEPCID ) 20 MG tablet TAKE 1 TABLET (20 MG TOTAL) BY MOUTH 2 (TWO) TIMES DAILY AS NEEDED FOR HEARTBURN OR INDIGESTION.   FARXIGA 10 MG TABS tablet Take 10 mg by mouth as needed.   HYZAAR  100-25 MG tablet Take 1 tablet by mouth daily.   NORVASC  10 MG tablet Take 1.5 tablets (15 mg total) by mouth daily.   No facility-administered medications prior to visit.    Review of Systems  Constitutional: Negative.  Negative for weight loss.  HENT: Negative.    Eyes: Negative.   Respiratory: Negative.    Cardiovascular: Negative.   Gastrointestinal: Negative.   Genitourinary: Negative.   Skin: Negative.   Neurological: Negative.   Endo/Heme/Allergies: Negative.        Objective:   BP 130/78   Pulse (!) 54   Ht 6' 3 (1.905 m)   Wt 275 lb (124.7 kg)   SpO2 99%   BMI 34.37 kg/m   Vitals:   01/25/24 1328  BP: 130/78  Pulse: (!) 54  Height: 6' 3 (1.905 m)  Weight: 275 lb (124.7 kg)  SpO2: 99%  BMI (Calculated): 34.37    Physical Exam Vitals reviewed.  Constitutional:      Appearance: Normal appearance.  HENT:     Head: Normocephalic.     Left Ear: There is no impacted cerumen.     Nose: Nose normal.     Mouth/Throat:     Mouth: Mucous membranes are moist.     Pharynx: No posterior oropharyngeal erythema.  Eyes:     Extraocular Movements: Extraocular movements intact.     Pupils: Pupils are equal, round, and reactive to light.   Cardiovascular:     Rate and Rhythm: Regular rhythm.     Chest Wall: PMI is not displaced.     Pulses: Normal pulses.     Heart sounds: Normal heart sounds. No murmur heard. Pulmonary:     Effort: Pulmonary effort  is normal.     Breath sounds: Normal air entry. No rhonchi or rales.  Abdominal:     General: Abdomen is flat. Bowel sounds are normal. There is no distension.     Palpations: Abdomen is soft. There is no hepatomegaly, splenomegaly or mass.     Tenderness: There is no abdominal tenderness.  Musculoskeletal:        General: Normal range of motion.     Cervical back: Normal range of motion and neck supple.     Right lower leg: No edema.     Left lower leg: No edema.  Skin:    General: Skin is warm and dry.  Neurological:     General: No focal deficit present.     Mental Status: He is alert and oriented to person, place, and time.     Cranial Nerves: No cranial nerve deficit.     Motor: No weakness.  Psychiatric:        Mood and Affect: Mood normal.        Behavior: Behavior normal.      No results found for any visits on 01/25/24.      Assessment & Plan:  As per problem list.  Problem List Items Addressed This Visit       Cardiovascular and Mediastinum   Essential hypertension     Other   Hyperlipemia   Relevant Orders   Lipid panel   Comprehensive metabolic panel with GFR   Prediabetes - Primary   Relevant Orders   Hemoglobin A1c   Bradycardia    Return in about 11 weeks (around 04/11/2024) for fu with labs prior.   Total time spent: 20 minutes  Sherrill Cinderella Perry, MD  01/25/2024   This document may have been prepared by [facility] Voice Recognition software and as such may include unintentional dictation errors.

## 2024-01-28 ENCOUNTER — Ambulatory Visit: Attending: Cardiovascular Disease | Admitting: Cardiovascular Disease

## 2024-01-28 ENCOUNTER — Encounter: Payer: Self-pay | Admitting: Cardiovascular Disease

## 2024-01-28 VITALS — BP 164/90 | HR 66 | Ht 75.0 in | Wt 276.0 lb

## 2024-01-28 DIAGNOSIS — I2510 Atherosclerotic heart disease of native coronary artery without angina pectoris: Secondary | ICD-10-CM | POA: Insufficient documentation

## 2024-01-28 DIAGNOSIS — E785 Hyperlipidemia, unspecified: Secondary | ICD-10-CM | POA: Diagnosis not present

## 2024-01-28 DIAGNOSIS — I493 Ventricular premature depolarization: Secondary | ICD-10-CM | POA: Insufficient documentation

## 2024-01-28 DIAGNOSIS — I10 Essential (primary) hypertension: Secondary | ICD-10-CM | POA: Insufficient documentation

## 2024-01-28 NOTE — Patient Instructions (Signed)

## 2024-01-28 NOTE — Progress Notes (Signed)
 Cardiology Office Note   Date:  01/28/2024   ID:  Samuel Mason, DOB [birth year], MRN [id number]  PCP:  Albina GORMAN Dine, MD  Cardiologist:   Deatrice Cage, MD   Chief Complaint  Patient presents with   Follow-up    Low HR. Meds reviewed verbally with pt.      History of Present Illness: Samuel Mason is a 73 y.o. male who presents for a followup visit regarding hypertension, coronary artery disease and frequent asymptomatic PVCs.   He is known to have refractory hypertension with no evidence of renal artery stenosis. He also suffers from chronic chest pain likely musculoskeletal with cardiac catheterization done twice in 2009 and 2011. Both times there was mild to moderate CAD without evidence of obstructive disease. He also has known history of sleep apnea did not tolerate CPAP well. He was first noted to have frequent PVCs years ago during his sleep study and has been treated medically with a beta blocker. Most recent cardiac catheterization in August 2023 showed stable moderate mid LAD stenosis, 90% stenosis in mid RCA which was small and nondominant and occluded small OM 2 with collaterals.  He has been doing reasonably well with rare episodes of chest pain and no shortness of breath.  There was a concern about bradycardia with heart rate in the 50s.  He denies dizziness, syncope or presyncope.  Past Medical History:  Diagnosis Date   Asymptomatic PVCs    a. 07/2012 Holter: 7965 PVCs in 48 hrs; b. 07/2015 24hr Holter: 6000 PVCs (7%).   CAD (coronary artery disease)    a. 2009 Cath: nonobs dzs; b. 02/2010 Cath: LAD 21m, D1 90, LCX 13m, RCA min irregs, EF 60%-->Med Rx; c. 07/2012 Neg ETT; d. 05/2015 Neg MV. EF 73%.   Carotid arterial disease    a. 11/2016 Carotid u/s: mild bilat carotid plaques.   CKD (chronic kidney disease), stage III (HCC)    a. Creat 1.67 05/22/2017.   GERD (gastroesophageal reflux disease)    a. noncompliant w/ PPI.   History of echocardiogram    a.  09/2016 Echo: EF 60-65%, no orwma, mild MR, mildly dil LA/RA.   Hyperlipidemia    Hypertension    Morbid obesity (HCC)    Obstructive sleep apnea    a. noncompliant w/ CPAP.   TIA (transient ischemic attack)    Tumor of parotid gland     Past Surgical History:  Procedure Laterality Date   CARDIAC CATHETERIZATION  2011   50% mid LAD stenosis, 40% proximal LCX, EF 60%   CARDIAC CATHETERIZATION  2009   Mild CAD             COLONOSCOPY N/A 11/25/2023   Procedure: COLONOSCOPY;  Surgeon: Therisa Bi, MD;  Location: [facility] ENDOSCOPY;  Service: Gastroenterology;  Laterality: N/A;   COLONOSCOPY WITH PROPOFOL  N/A 10/19/2018   Procedure: COLONOSCOPY WITH PROPOFOL ;  Surgeon: Therisa Bi, MD;  Location: [facility] ENDOSCOPY;  Service: Gastroenterology;  Laterality: N/A;   LEFT HEART CATH AND CORONARY ANGIOGRAPHY N/A 11/27/2021   Procedure: LEFT HEART CATH AND CORONARY ANGIOGRAPHY with poss PCI;  Surgeon: Cage Deatrice LABOR, MD;  Location: [facility] INVASIVE CV LAB;  Service: Cardiovascular;  Laterality: N/A;   POLYPECTOMY  11/25/2023   Procedure: POLYPECTOMY, INTESTINE;  Surgeon: Therisa Bi, MD;  Location: [facility] ENDOSCOPY;  Service: Gastroenterology;;     Current Outpatient Medications  Medication Sig Dispense Refill   ascorbic acid (VITAMIN C) 1000 MG tablet Take 1,000 mg by mouth  daily.     aspirin  81 MG tablet Take 81 mg by mouth daily.     carvedilol  (COREG ) 6.25 MG tablet Take 1 tablet (6.25 mg total) by mouth 2 (two) times daily. 180 tablet 1   CRESTOR  10 MG tablet Take 1 tablet (10 mg total) by mouth daily. 90 tablet 1   eplerenone  (INSPRA ) 25 MG tablet Take 1 tablet (25 mg total) by mouth daily. 90 tablet 1   famotidine  (PEPCID ) 20 MG tablet TAKE 1 TABLET (20 MG TOTAL) BY MOUTH 2 (TWO) TIMES DAILY AS NEEDED FOR HEARTBURN OR INDIGESTION. 180 tablet 1   FARXIGA 10 MG TABS tablet Take 10 mg by mouth as needed. (Patient taking differently: Take 5 mg by mouth every other day.)     GARLIC PO Take 600 mg by mouth  daily.     HYZAAR  100-25 MG tablet Take 1 tablet by mouth daily. 90 tablet 1   NORVASC  10 MG tablet Take 1.5 tablets (15 mg total) by mouth daily. 135 tablet 1   Nutritional Supplements (SALMON OIL PO) Take by mouth daily.     No current facility-administered medications for this visit.    Allergies:   Simvastatin    Social History:  The patient  reports that he has never smoked. He has never used smokeless tobacco. He reports that he does not drink alcohol and does not use drugs.   Family History:  The patient's family history includes Heart attack in his father.    ROS:  Please see the history of present illness.   Otherwise, review of systems are positive for none.   All other systems are reviewed and negative.    PHYSICAL EXAM: VS:  BP (!) 164/90 (BP Location: Left Arm, Patient Position: Sitting, Cuff Size: Large)   Pulse 66   Ht 6' 3 (1.905 m)   Wt 276 lb (125.2 kg)   SpO2 98%   BMI 34.50 kg/m  , BMI Body mass index is 34.5 kg/m. GEN: Well nourished, well developed, in no acute distress  HEENT: normal  Neck: no JVD, carotid bruits, or masses Cardiac: RRR with premature beats; no murmurs, rubs, or gallops,no edema  Respiratory:  clear to auscultation bilaterally, normal work of breathing GI: soft, nontender, nondistended, + BS MS: no deformity or atrophy  Skin: warm and dry, no rash Neuro:  Strength and sensation are intact Psych: euthymic mood, full affect    EKG:  EKG is ordered today.  Sinus rhythm with 1st degree A-V block with Premature atrial complexes Left axis deviation Inferior infarct (cited on or before 05-Mar-2023) Anterior infarct (cited on or before 05-Mar-2023) When compared with ECG of 09-Apr-2023 19:11, Premature ventricular complexes are no longer Present   Recent Labs: 01/04/2024: ALT 15; BUN 18; Creatinine, Ser 1.84; Hemoglobin 14.0; Platelets 250; Potassium 4.2; Sodium 141    Lipid Panel    Component Value Date/Time   CHOL 176  01/04/2024 1430   TRIG 85 01/04/2024 1430   HDL 41 01/04/2024 1430   CHOLHDL 6.4 CALC 06/22/2006 1553   VLDL 18 06/22/2006 1553   LDLCALC 119 (H) 01/04/2024 1430   LDLDIRECT 183.4 06/22/2006 1553      Wt Readings from Last 3 Encounters:  01/28/24 276 lb (125.2 kg)  01/25/24 275 lb (124.7 kg)  12/24/23 275 lb (124.7 kg)         ASSESSMENT AND PLAN:  1. Asymptomatic PVCs: Symptoms are well-controlled with carvedilol .  2. Refractory hypertension: His blood pressure is mildly  elevated today but I reviewed his home blood pressure readings and they are almost all in the normal range.  I made no changes.  3. Hyperlipidemia: Continue treatment with rosuvastatin  with a target LDL of less than 70.  Recent lipid profile showed an LDL of 119.  He admits that he was not taking rosuvastatin  on regular basis.  I discussed with him the importance of compliance.  4.  Chronic kidney disease: Most recent creatinine was 1.84.  5.  Coronary artery disease involving native coronary arteries without angina: Currently with no anginal symptoms.  Continue medical therapy.  6.  Asymptomatic bradycardia: Even though his heart rate is in the 50s, he has no symptoms related to this.  He is on a beta-blocker by design given frequent PVCs.  Will continue to monitor for now.    Disposition:   FU with me in 6 months  Signed,  Deatrice Cage, MD  01/28/2024 8:27 AM    [facility]

## 2024-02-25 ENCOUNTER — Telehealth: Payer: Self-pay | Admitting: Internal Medicine

## 2024-02-25 NOTE — Telephone Encounter (Signed)
 Pt came to ask for a refill eplerenone  (the greenstone brand) setn to [facility] on [address]

## 2024-03-01 ENCOUNTER — Other Ambulatory Visit: Payer: Self-pay

## 2024-03-01 NOTE — Telephone Encounter (Signed)
 Sent request to PCP, looks like it was previously prescribed by Dr Darron

## 2024-03-02 ENCOUNTER — Ambulatory Visit: Admitting: Podiatry

## 2024-03-02 ENCOUNTER — Encounter: Payer: Self-pay | Admitting: Podiatry

## 2024-03-02 DIAGNOSIS — M79675 Pain in left toe(s): Secondary | ICD-10-CM

## 2024-03-02 DIAGNOSIS — B351 Tinea unguium: Secondary | ICD-10-CM

## 2024-03-02 DIAGNOSIS — M79674 Pain in right toe(s): Secondary | ICD-10-CM

## 2024-03-02 DIAGNOSIS — N183 Chronic kidney disease, stage 3 unspecified: Secondary | ICD-10-CM

## 2024-03-02 NOTE — Progress Notes (Signed)
This patient returns to my office for at risk foot care.  This patient requires this care by a professional since this patient will be at risk due to having PAD and chronic kidney disease  This patient is unable to cut nails himself since the patient cannot reach his nails.These nails are painful walking and wearing shoes.  This patient presents for at risk foot care today.  General Appearance  Alert, conversant and in no acute stress.  Vascular  Dorsalis pedis and posterior tibial  pulses are palpable  bilaterally.  Capillary return is within normal limits  bilaterally. Temperature is within normal limits  bilaterally.  Neurologic  Senn-Weinstein monofilament wire test within normal limits  bilaterally. Muscle power within normal limits bilaterally.  Nails Thick disfigured discolored nails with subungual debris  from hallux to fifth toes bilaterally. No evidence of bacterial infection or drainage bilaterally.  Orthopedic  No limitations of motion  feet .  No crepitus or effusions noted.  No bony pathology or digital deformities noted.  Skin  normotropic skin with no porokeratosis noted bilaterally.  No signs of infections or ulcers noted.     Onychomycosis  Pain in right toes  Pain in left toes  Consent was obtained for treatment procedures.   Mechanical debridement of nails 1-5  bilaterally performed with a nail nipper.  Filed with dremel without incident.    Return office visit    4 months                 Told patient to return for periodic foot care and evaluation due to potential at risk complications.   Carnella Fryman DPM  

## 2024-03-10 ENCOUNTER — Other Ambulatory Visit: Payer: Self-pay | Admitting: Cardiovascular Disease

## 2024-03-11 MED ORDER — EPLERENONE 25 MG PO TABS: 25.0000 mg | ORAL_TABLET | Freq: Every day | ORAL | 3 refills | Status: AC

## 2024-03-24 ENCOUNTER — Ambulatory Visit: Admitting: Internal Medicine

## 2024-03-24 ENCOUNTER — Ambulatory Visit: Admitting: Podiatry

## 2024-04-11 ENCOUNTER — Ambulatory Visit: Admitting: Internal Medicine

## 2024-05-09 ENCOUNTER — Ambulatory Visit: Admitting: Internal Medicine

## 2024-05-09 VITALS — BP 116/72 | HR 73 | Temp 97.9°F | Ht 75.0 in | Wt 273.8 lb

## 2024-05-09 DIAGNOSIS — E782 Mixed hyperlipidemia: Secondary | ICD-10-CM | POA: Diagnosis not present

## 2024-05-09 DIAGNOSIS — I10 Essential (primary) hypertension: Secondary | ICD-10-CM

## 2024-05-09 DIAGNOSIS — R7303 Prediabetes: Secondary | ICD-10-CM | POA: Diagnosis not present

## 2024-05-09 MED ORDER — HYZAAR 100-25 MG PO TABS: 1.0000 | ORAL_TABLET | Freq: Every day | ORAL | 1 refills | Status: AC

## 2024-05-09 NOTE — Progress Notes (Signed)
 "  Established Patient Office Visit  Subjective:  Patient ID: Samuel Mason, male    DOB: Nov 24, 1950  Age: 74 y.o. MRN: [id number]  Chief Complaint  Patient presents with   Follow-up    11 week follow up    No new complaints, here for lab review and medication refills.     No other concerns at this time.   Past Medical History:  Diagnosis Date   Asymptomatic PVCs    a. 07/2012 Holter: 7965 PVCs in 48 hrs; b. 07/2015 24hr Holter: 6000 PVCs (7%).   CAD (coronary artery disease)    a. 2009 Cath: nonobs dzs; b. 02/2010 Cath: LAD 36m, D1 90, LCX 60m, RCA min irregs, EF 60%-->Med Rx; c. 07/2012 Neg ETT; d. 05/2015 Neg MV. EF 73%.   Carotid arterial disease    a. 11/2016 Carotid u/Kaleem Sartwell: mild bilat carotid plaques.   CKD (chronic kidney disease), stage III (HCC)    a. Creat 1.67 05/22/2017.   GERD (gastroesophageal reflux disease)    a. noncompliant w/ PPI.   History of echocardiogram    a. 09/2016 Echo: EF 60-65%, no orwma, mild MR, mildly dil LA/RA.   Hyperlipidemia    Hypertension    Morbid obesity (HCC)    Obstructive sleep apnea    a. noncompliant w/ CPAP.   TIA (transient ischemic attack)    Tumor of parotid gland     Past Surgical History:  Procedure Laterality Date   CARDIAC CATHETERIZATION  2011   50% mid LAD stenosis, 40% proximal LCX, EF 60%   CARDIAC CATHETERIZATION  2009   Mild CAD             COLONOSCOPY N/A 11/25/2023   Procedure: COLONOSCOPY;  Surgeon: Therisa Bi, MD;  Location: [facility] ENDOSCOPY;  Service: Gastroenterology;  Laterality: N/A;   COLONOSCOPY WITH PROPOFOL  N/A 10/19/2018   Procedure: COLONOSCOPY WITH PROPOFOL ;  Surgeon: Therisa Bi, MD;  Location: [facility] ENDOSCOPY;  Service: Gastroenterology;  Laterality: N/A;   LEFT HEART CATH AND CORONARY ANGIOGRAPHY N/A 11/27/2021   Procedure: LEFT HEART CATH AND CORONARY ANGIOGRAPHY with poss PCI;  Surgeon: Darron Deatrice LABOR, MD;  Location: [facility] INVASIVE CV LAB;  Service: Cardiovascular;  Laterality: N/A;   POLYPECTOMY   11/25/2023   Procedure: POLYPECTOMY, INTESTINE;  Surgeon: Therisa Bi, MD;  Location: [facility] ENDOSCOPY;  Service: Gastroenterology;;    Social History   Socioeconomic History   Marital status: Married    Spouse name: Grayce   Number of children: 2   Years of education: Not on file   Highest education level: Not on file  Occupational History   Not on file  Tobacco Use   Smoking status: Never   Smokeless tobacco: Never  Vaping Use   Vaping status: Never Used  Substance and Sexual Activity   Alcohol use: No    Comment: social  drinker   Drug use: No   Sexual activity: Not on file  Other Topics Concern   Not on file  Social History Narrative   Lives at home with wife; kids out the house: 1 in [place], 1 in [place].    Social Drivers of Health   Tobacco Use: Low Risk (03/02/2024)   Patient History    Smoking Tobacco Use: Never    Smokeless Tobacco Use: Never    Passive Exposure: Not on file  Financial Resource Strain: Low Risk  (02/12/2023)   Received from [facility]   Overall Financial Resource Strain (CARDIA)    Difficulty  of Paying Living Expenses: Not hard at all  Food Insecurity: No Food Insecurity (02/12/2023)   Received from [facility]   Epic    Within the past 12 months, you worried that your food would run out before you got the money to buy more.: Never true    Within the past 12 months, the food you bought just didn't last and you didn't have money to get more.: Never true  Transportation Needs: No Transportation Needs (02/12/2023)   Received from [facility] - Transportation    In the past 12 months, has lack of transportation kept you from medical appointments or from getting medications?: No    Lack of Transportation (Non-Medical): No  Physical Activity: Not on file  Stress: Not on file  Social Connections: Not on file  Intimate Partner Violence: Not on file  Depression (PHQ2-9): Low Risk  (12/24/2023)   Depression (PHQ2-9)    PHQ-2 Score: 0  Alcohol Screen: Not on file  Housing: Low Risk  (04/28/2023)   Received from [facility]   Epic    In the last 12 months, was there a time when you were not able to pay the mortgage or rent on time?: No    In the past 12 months, how many times have you moved where you were living?: 0    At any time in the past 12 months, were you homeless or living in a shelter (including now)?: No  Utilities: Not At Risk (02/12/2023)   Received from [facility] Utilities    Threatened with loss of utilities: No  Health Literacy: Not on file    Family History  Problem Relation Age of Onset   Heart attack Father        MI    Allergies[1]  Show/hide medication list[2]  Review of Systems  Constitutional:  Positive for weight loss (3 lbs).  HENT: Negative.    Eyes: Negative.   Respiratory: Negative.    Cardiovascular: Negative.   Gastrointestinal: Negative.   Genitourinary: Negative.   Skin: Negative.   Neurological: Negative.   Endo/Heme/Allergies: Negative.        Objective:   BP 116/72   Pulse 73   Temp 97.9 F (36.6 C)   Ht 6' 3 (1.905 m)   Wt 273 lb 12.8 oz (124.2 kg)   SpO2 98%   BMI 34.22 kg/m   Vitals:   05/09/24 1112  BP: 116/72  Pulse: 73  Temp: 97.9 F (36.6 C)  Height: 6' 3 (1.905 m)  Weight: 273 lb 12.8 oz (124.2 kg)  SpO2: 98%  BMI (Calculated): 34.22    Physical Exam Vitals reviewed.  Constitutional:      Appearance: Normal appearance.  HENT:     Head: Normocephalic.     Left Ear: There is no impacted cerumen.     Nose: Nose normal.     Mouth/Throat:     Mouth: Mucous membranes are moist.     Pharynx: No posterior oropharyngeal erythema.  Eyes:     Extraocular Movements: Extraocular movements intact.     Pupils: Pupils are equal, round, and reactive to light.  Cardiovascular:     Rate and Rhythm: Regular rhythm.     Chest Wall: PMI is not displaced.      Pulses: Normal pulses.     Heart sounds: Normal heart sounds. No murmur heard. Pulmonary:     Effort: Pulmonary effort  is normal.     Breath sounds: Normal air entry. No rhonchi or rales.  Abdominal:     General: Abdomen is flat. Bowel sounds are normal. There is no distension.     Palpations: Abdomen is soft. There is no hepatomegaly, splenomegaly or mass.     Tenderness: There is no abdominal tenderness.  Musculoskeletal:        General: Normal range of motion.     Cervical back: Normal range of motion and neck supple.     Right lower leg: No edema.     Left lower leg: No edema.  Skin:    General: Skin is warm and dry.  Neurological:     General: No focal deficit present.     Mental Status: He is alert and oriented to person, place, and time.     Cranial Nerves: No cranial nerve deficit.     Motor: No weakness.  Psychiatric:        Mood and Affect: Mood normal.        Behavior: Behavior normal.      No results found for any visits on 05/09/24.  No results found for this or any previous visit (from the past 2160 hours).    Assessment & Plan:  Gordon was seen today for follow-up.  Essential hypertension Overview: Qualifier: Diagnosis of  By: Jimmy MD, Charlie Scarlet   Orders: -     Hyzaar ; Take 1 tablet by mouth daily.  Dispense: 90 tablet; Refill: 1  Prediabetes  Mixed hyperlipidemia    Problem List Items Addressed This Visit       Cardiovascular and Mediastinum   Essential hypertension - Primary   Relevant Medications   HYZAAR  100-25 MG tablet     Other   Hyperlipemia   Relevant Medications   HYZAAR  100-25 MG tablet   Prediabetes    Return in about 3 weeks (around 05/30/2024) for awv with labs prior.   Total time spent: 20 minutes. This time includes review of previous notes and results and patient face to face interaction during today'Brian Zeitlin visit.    Sherrill Cinderella Perry, MD  05/09/2024   This document may have been prepared by [facility] Voice  Recognition software and as such may include unintentional dictation errors.     [1]  Allergies Allergen Reactions   Simvastatin     REACTION: Nausea  [2]  Outpatient Medications Prior to Visit  Medication Sig   ascorbic acid (VITAMIN C) 1000 MG tablet Take 1,000 mg by mouth daily.   aspirin  81 MG tablet Take 81 mg by mouth daily.   carvedilol  (COREG ) 6.25 MG tablet Take 1 tablet (6.25 mg total) by mouth 2 (two) times daily.   CRESTOR  10 MG tablet Take 1 tablet (10 mg total) by mouth daily.   eplerenone  (INSPRA ) 25 MG tablet Take 1 tablet (25 mg total) by mouth daily.   famotidine  (PEPCID ) 20 MG tablet TAKE 1 TABLET (20 MG TOTAL) BY MOUTH 2 (TWO) TIMES DAILY AS NEEDED FOR HEARTBURN OR INDIGESTION.   FARXIGA 10 MG TABS tablet Take 10 mg by mouth as needed. (Patient taking differently: Take 5 mg by mouth every other day.)   GARLIC PO Take 600 mg by mouth daily.   NORVASC  10 MG tablet Take 1.5 tablets (15 mg total) by mouth daily.   Nutritional Supplements (SALMON OIL PO) Take by mouth daily.   [DISCONTINUED] HYZAAR  100-25 MG tablet Take 1 tablet by mouth daily.   No facility-administered medications prior to  visit.   "

## 2024-05-10 LAB — COMPREHENSIVE METABOLIC PANEL WITH GFR
ALT: 18 IU/L (ref 0–44)
AST: 24 IU/L (ref 0–40)
Albumin: 4 g/dL (ref 3.8–4.8)
Alkaline Phosphatase: 75 IU/L (ref 47–123)
BUN/Creatinine Ratio: 14 (ref 10–24)
BUN: 24 mg/dL (ref 8–27)
Bilirubin Total: 0.7 mg/dL (ref 0.0–1.2)
CO2: 25 mmol/L (ref 20–29)
Calcium: 9.4 mg/dL (ref 8.6–10.2)
Chloride: 100 mmol/L (ref 96–106)
Creatinine, Ser: 1.76 mg/dL — ABNORMAL HIGH (ref 0.76–1.27)
Globulin, Total: 3.5 g/dL (ref 1.5–4.5)
Glucose: 105 mg/dL — ABNORMAL HIGH (ref 70–99)
Potassium: 4 mmol/L (ref 3.5–5.2)
Sodium: 141 mmol/L (ref 134–144)
Total Protein: 7.5 g/dL (ref 6.0–8.5)
eGFR: 41 mL/min/1.73 — ABNORMAL LOW

## 2024-05-10 LAB — LIPID PANEL
Chol/HDL Ratio: 4.6 ratio (ref 0.0–5.0)
Cholesterol, Total: 189 mg/dL (ref 100–199)
HDL: 41 mg/dL
LDL Chol Calc (NIH): 130 mg/dL — ABNORMAL HIGH (ref 0–99)
Triglycerides: 101 mg/dL (ref 0–149)
VLDL Cholesterol Cal: 18 mg/dL (ref 5–40)

## 2024-05-10 LAB — HEMOGLOBIN A1C
Est. average glucose Bld gHb Est-mCnc: 126 mg/dL
Hgb A1c MFr Bld: 6 % — ABNORMAL HIGH (ref 4.8–5.6)

## 2024-05-30 ENCOUNTER — Ambulatory Visit: Admitting: Internal Medicine

## 2024-06-30 ENCOUNTER — Ambulatory Visit: Admitting: Podiatry
